# Patient Record
Sex: Male | Born: 1940 | Race: Black or African American | Hispanic: No | Marital: Married | Smoking: Never smoker
Health system: Southern US, Community
[De-identification: ages and names within clinical notes are randomized; demographics above are authoritative.]

## PROBLEM LIST (undated history)

## (undated) DIAGNOSIS — E119 Type 2 diabetes mellitus without complications: Secondary | ICD-10-CM

## (undated) DIAGNOSIS — J449 Chronic obstructive pulmonary disease, unspecified: Secondary | ICD-10-CM

## (undated) DIAGNOSIS — I10 Essential (primary) hypertension: Secondary | ICD-10-CM

## (undated) HISTORY — PX: NASAL POLYP EXCISION: SHX2068

## (undated) HISTORY — PX: BRAIN SURGERY: SHX531

## (undated) HISTORY — DX: Type 2 diabetes mellitus without complications: E11.9

---

## 1998-03-12 ENCOUNTER — Emergency Department (HOSPITAL_COMMUNITY): Admission: EM | Admit: 1998-03-12 | Discharge: 1998-03-12 | Payer: Self-pay | Admitting: Emergency Medicine

## 2005-11-03 ENCOUNTER — Emergency Department (HOSPITAL_COMMUNITY): Admission: EM | Admit: 2005-11-03 | Discharge: 2005-11-03 | Payer: Self-pay | Admitting: Emergency Medicine

## 2005-12-26 ENCOUNTER — Emergency Department (HOSPITAL_COMMUNITY): Admission: EM | Admit: 2005-12-26 | Discharge: 2005-12-26 | Payer: Self-pay | Admitting: Emergency Medicine

## 2005-12-31 ENCOUNTER — Emergency Department (HOSPITAL_COMMUNITY): Admission: EM | Admit: 2005-12-31 | Discharge: 2005-12-31 | Payer: Self-pay | Admitting: Emergency Medicine

## 2006-01-01 ENCOUNTER — Emergency Department: Payer: Self-pay | Admitting: Emergency Medicine

## 2006-02-11 ENCOUNTER — Ambulatory Visit: Payer: Self-pay | Admitting: Unknown Physician Specialty

## 2007-04-05 ENCOUNTER — Emergency Department: Payer: Self-pay | Admitting: Unknown Physician Specialty

## 2007-12-20 ENCOUNTER — Other Ambulatory Visit: Payer: Self-pay

## 2007-12-20 ENCOUNTER — Inpatient Hospital Stay: Payer: Self-pay | Admitting: Internal Medicine

## 2008-02-15 ENCOUNTER — Ambulatory Visit: Payer: Self-pay | Admitting: Internal Medicine

## 2009-01-20 ENCOUNTER — Emergency Department: Payer: Self-pay | Admitting: Emergency Medicine

## 2009-09-25 ENCOUNTER — Inpatient Hospital Stay: Payer: Self-pay | Admitting: Internal Medicine

## 2010-01-30 ENCOUNTER — Ambulatory Visit: Payer: Self-pay | Admitting: Gastroenterology

## 2011-10-20 HISTORY — PX: BRAIN SURGERY: SHX531

## 2012-06-06 ENCOUNTER — Ambulatory Visit: Payer: Self-pay | Admitting: Internal Medicine

## 2012-06-06 DIAGNOSIS — S065XAA Traumatic subdural hemorrhage with loss of consciousness status unknown, initial encounter: Secondary | ICD-10-CM

## 2012-06-06 HISTORY — DX: Traumatic subdural hemorrhage with loss of consciousness status unknown, initial encounter: S06.5XAA

## 2012-07-20 DIAGNOSIS — I82409 Acute embolism and thrombosis of unspecified deep veins of unspecified lower extremity: Secondary | ICD-10-CM

## 2012-07-20 HISTORY — DX: Acute embolism and thrombosis of unspecified deep veins of unspecified lower extremity: I82.409

## 2012-08-10 ENCOUNTER — Encounter: Payer: Self-pay | Admitting: Rehabilitation

## 2012-08-19 ENCOUNTER — Encounter: Payer: Self-pay | Admitting: Rehabilitation

## 2012-09-18 ENCOUNTER — Encounter: Payer: Self-pay | Admitting: Rehabilitation

## 2015-08-25 ENCOUNTER — Emergency Department
Admission: EM | Admit: 2015-08-25 | Discharge: 2015-08-25 | Disposition: A | Discharge status: Home / Self Care | Payer: Medicare Other | Attending: Emergency Medicine | Admitting: Emergency Medicine

## 2015-08-25 ENCOUNTER — Emergency Department: Payer: Medicare Other

## 2015-08-25 ENCOUNTER — Encounter: Payer: Self-pay | Admitting: Emergency Medicine

## 2015-08-25 DIAGNOSIS — R509 Fever, unspecified: Secondary | ICD-10-CM

## 2015-08-25 DIAGNOSIS — J440 Chronic obstructive pulmonary disease with acute lower respiratory infection: Secondary | ICD-10-CM | POA: Insufficient documentation

## 2015-08-25 DIAGNOSIS — Z7951 Long term (current) use of inhaled steroids: Secondary | ICD-10-CM | POA: Diagnosis not present

## 2015-08-25 DIAGNOSIS — I10 Essential (primary) hypertension: Secondary | ICD-10-CM | POA: Diagnosis not present

## 2015-08-25 DIAGNOSIS — Z79899 Other long term (current) drug therapy: Secondary | ICD-10-CM | POA: Diagnosis not present

## 2015-08-25 DIAGNOSIS — J40 Bronchitis, not specified as acute or chronic: Secondary | ICD-10-CM

## 2015-08-25 DIAGNOSIS — B349 Viral infection, unspecified: Secondary | ICD-10-CM

## 2015-08-25 HISTORY — DX: Chronic obstructive pulmonary disease, unspecified: J44.9

## 2015-08-25 HISTORY — DX: Essential (primary) hypertension: I10

## 2015-08-25 LAB — URINALYSIS COMPLETE WITH MICROSCOPIC (ARMC ONLY)
BILIRUBIN URINE: NEGATIVE
Bacteria, UA: NONE SEEN
Glucose, UA: NEGATIVE mg/dL
KETONES UR: NEGATIVE mg/dL
Leukocytes, UA: NEGATIVE
Nitrite: NEGATIVE
Protein, ur: 30 mg/dL — AB
Specific Gravity, Urine: 1.013 (ref 1.005–1.030)
Squamous Epithelial / LPF: NONE SEEN
pH: 5 (ref 5.0–8.0)

## 2015-08-25 LAB — CBC WITH DIFFERENTIAL/PLATELET
BASOS ABS: 0 10*3/uL (ref 0–0.1)
Basophils Relative: 0 %
EOS PCT: 4 %
Eosinophils Absolute: 0.2 10*3/uL (ref 0–0.7)
HCT: 45.6 % (ref 40.0–52.0)
Hemoglobin: 15 g/dL (ref 13.0–18.0)
LYMPHS PCT: 11 %
Lymphs Abs: 0.4 10*3/uL — ABNORMAL LOW (ref 1.0–3.6)
MCH: 29.5 pg (ref 26.0–34.0)
MCHC: 32.9 g/dL (ref 32.0–36.0)
MCV: 89.6 fL (ref 80.0–100.0)
Monocytes Absolute: 0 10*3/uL — ABNORMAL LOW (ref 0.2–1.0)
Monocytes Relative: 1 %
NEUTROS PCT: 84 %
Neutro Abs: 3.1 10*3/uL (ref 1.4–6.5)
PLATELETS: 185 10*3/uL (ref 150–440)
RBC: 5.1 MIL/uL (ref 4.40–5.90)
RDW: 12.7 % (ref 11.5–14.5)
WBC: 3.7 10*3/uL — AB (ref 3.8–10.6)

## 2015-08-25 LAB — COMPREHENSIVE METABOLIC PANEL
ALBUMIN: 3.7 g/dL (ref 3.5–5.0)
ALT: 32 U/L (ref 17–63)
ANION GAP: 6 (ref 5–15)
AST: 31 U/L (ref 15–41)
Alkaline Phosphatase: 66 U/L (ref 38–126)
BILIRUBIN TOTAL: 1.1 mg/dL (ref 0.3–1.2)
BUN: 17 mg/dL (ref 6–20)
CALCIUM: 8.8 mg/dL — AB (ref 8.9–10.3)
CO2: 30 mmol/L (ref 22–32)
Chloride: 103 mmol/L (ref 101–111)
Creatinine, Ser: 1.13 mg/dL (ref 0.61–1.24)
GFR calc Af Amer: >60 mL/min (ref >60)
GFR calc non Af Amer: >60 mL/min (ref >60)
GLUCOSE: 134 mg/dL — AB (ref 65–99)
POTASSIUM: 4 mmol/L (ref 3.5–5.1)
SODIUM: 139 mmol/L (ref 135–145)
Total Protein: 7.1 g/dL (ref 6.5–8.1)

## 2015-08-25 LAB — PROTIME-INR
INR: 0.97
Prothrombin Time: 13.1 seconds (ref 11.4–15.0)

## 2015-08-25 LAB — LACTIC ACID, PLASMA: Lactic Acid, Venous: 2 mmol/L (ref 0.5–2.0)

## 2015-08-25 LAB — TROPONIN I

## 2015-08-25 LAB — LIPASE, BLOOD: Lipase: 26 U/L (ref 11–51)

## 2015-08-25 LAB — APTT: APTT: 24 s (ref 24–36)

## 2015-08-25 MED ORDER — ACETAMINOPHEN 500 MG PO TABS
ORAL_TABLET | ORAL | Status: AC
  Administered 2015-08-25: 1000 mg via ORAL
  Filled 2015-08-25: qty 2

## 2015-08-25 MED ORDER — VANCOMYCIN HCL IN DEXTROSE 1-5 GM/200ML-% IV SOLN
1000.0000 mg | Freq: Once | INTRAVENOUS | Status: AC
  Administered 2015-08-25: 1000 mg via INTRAVENOUS
  Filled 2015-08-25: qty 200

## 2015-08-25 MED ORDER — ACETAMINOPHEN 500 MG PO TABS
1000.0000 mg | ORAL_TABLET | Freq: Once | ORAL | Status: AC
  Administered 2015-08-25: 1000 mg via ORAL

## 2015-08-25 MED ORDER — PREDNISONE 20 MG PO TABS: 60.0000 mg | ORAL_TABLET | Freq: Every day | ORAL | Status: DC

## 2015-08-25 MED ORDER — SODIUM CHLORIDE 0.9 % IV BOLUS (SEPSIS)
1000.0000 mL | INTRAVENOUS | Status: AC
  Administered 2015-08-25 (×3): 1000 mL via INTRAVENOUS

## 2015-08-25 MED ORDER — PIPERACILLIN-TAZOBACTAM 3.375 G IVPB 30 MIN
3.3750 g | Freq: Once | INTRAVENOUS | Status: AC
  Administered 2015-08-25: 3.375 g via INTRAVENOUS
  Filled 2015-08-25: qty 50

## 2015-08-25 MED ORDER — AZITHROMYCIN 250 MG PO TABS: ORAL_TABLET | ORAL | Status: DC
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
## 2015-08-25 NOTE — Discharge Instructions (Signed)
As we discussed, we do not know for sure what is causing your fever, chills, and other symptoms today.  You may simply be suffering from a viral infection, but given your abnormal vital signs, we encouraged her to stay in the hospital.  However, since you do not want to stay, we encourage you to take the medications prescribed, follow up with your regular doctor at the next available opportunity, and return immediately to the emergency department if you develop any new or worsening symptoms that concern you.   Upper Respiratory Infection, Adult Most upper respiratory infections (URIs) are a viral infection of the air passages leading to the lungs. A URI affects the nose, throat, and upper air passages. The most common type of URI is nasopharyngitis and is typically referred to as "the common cold." URIs run their course and usually go away on their own. Most of the time, a URI does not require medical attention, but sometimes a bacterial infection in the upper airways can follow a viral infection. This is called a secondary infection. Sinus and middle ear infections are common types of secondary upper respiratory infections. Bacterial pneumonia can also complicate a URI. A URI can worsen asthma and chronic obstructive pulmonary disease (COPD). Sometimes, these complications can require emergency medical care and may be life threatening.  CAUSES Almost all URIs are caused by viruses. A virus is a type of germ and can spread from one person to another.  RISKS FACTORS You may be at risk for a URI if:   You smoke.   You have chronic heart or lung disease.  You have a weakened defense (immune) system.   You are very young or very old.   You have nasal allergies or asthma.  You work in crowded or poorly ventilated areas.  You work in health care facilities or schools. SIGNS AND SYMPTOMS  Symptoms typically develop 2-3 days after you come in contact with a cold virus. Most viral URIs last 7-10  days. However, viral URIs from the influenza virus (flu virus) can last 14-18 days and are typically more severe. Symptoms may include:   Runny or stuffy (congested) nose.   Sneezing.   Cough.   Sore throat.   Headache.   Fatigue.   Fever.   Loss of appetite.   Pain in your forehead, behind your eyes, and over your cheekbones (sinus pain).  Muscle aches.  DIAGNOSIS  Your health care provider may diagnose a URI by:  Physical exam.  Tests to check that your symptoms are not due to another condition such as:  Strep throat.  Sinusitis.  Pneumonia.  Asthma. TREATMENT  A URI goes away on its own with time. It cannot be cured with medicines, but medicines may be prescribed or recommended to relieve symptoms. Medicines may help:  Reduce your fever.  Reduce your cough.  Relieve nasal congestion. HOME CARE INSTRUCTIONS   Take medicines only as directed by your health care provider.   Gargle warm saltwater or take cough drops to comfort your throat as directed by your health care provider.  Use a warm mist humidifier or inhale steam from a shower to increase air moisture. This may make it easier to breathe.  Drink enough fluid to keep your urine clear or pale yellow.   Eat soups and other clear broths and maintain good nutrition.   Rest as needed.   Return to work when your temperature has returned to normal or as your health care provider advises. You  may need to stay home longer to avoid infecting others. You can also use a face mask and careful hand washing to prevent spread of the virus.  Increase the usage of your inhaler if you have asthma.   Do not use any tobacco products, including cigarettes, chewing tobacco, or electronic cigarettes. If you need help quitting, ask your health care provider. PREVENTION  The best way to protect yourself from getting a cold is to practice good hygiene.   Avoid oral or hand contact with people with cold  symptoms.   Wash your hands often if contact occurs.  There is no clear evidence that vitamin C, vitamin E, echinacea, or exercise reduces the chance of developing a cold. However, it is always recommended to get plenty of rest, exercise, and practice good nutrition.  SEEK MEDICAL CARE IF:   You are getting worse rather than better.   Your symptoms are not controlled by medicine.   You have chills.  You have worsening shortness of breath.  You have brown or red mucus.  You have yellow or brown nasal discharge.  You have pain in your face, especially when you bend forward.  You have a fever.  You have swollen neck glands.  You have pain while swallowing.  You have white areas in the back of your throat. SEEK IMMEDIATE MEDICAL CARE IF:   You have severe or persistent:  Headache.  Ear pain.  Sinus pain.  Chest pain.  You have chronic lung disease and any of the following:  Wheezing.  Prolonged cough.  Coughing up blood.  A change in your usual mucus.  You have a stiff neck.  You have changes in your:  Vision.  Hearing.  Thinking.  Mood. MAKE SURE YOU:   Understand these instructions.  Will watch your condition.  Will get help right away if you are not doing well or get worse.   This information is not intended to replace advice given to you by your health care provider. Make sure you discuss any questions you have with your health care provider.   Document Released: 03/31/2001 Document Revised: 02/19/2015 Document Reviewed: 01/10/2014 Elsevier Interactive Patient Education [employer].

## 2015-08-25 NOTE — ED Notes (Signed)
Patient reports having chills.  Denies any other complaints.

## 2015-08-26 ENCOUNTER — Telehealth: Payer: Self-pay | Admitting: Emergency Medicine

## 2015-08-26 ENCOUNTER — Encounter: Payer: Self-pay | Admitting: Emergency Medicine

## 2015-08-26 ENCOUNTER — Emergency Department
Admission: EM | Admit: 2015-08-26 | Discharge: 2015-08-26 | Disposition: A | Discharge status: Home / Self Care | Payer: Medicare Other | Attending: Emergency Medicine | Admitting: Emergency Medicine

## 2015-08-26 DIAGNOSIS — R7881 Bacteremia: Secondary | ICD-10-CM | POA: Diagnosis not present

## 2015-08-26 DIAGNOSIS — Z792 Long term (current) use of antibiotics: Secondary | ICD-10-CM | POA: Insufficient documentation

## 2015-08-26 DIAGNOSIS — Z7952 Long term (current) use of systemic steroids: Secondary | ICD-10-CM | POA: Diagnosis not present

## 2015-08-26 DIAGNOSIS — R0981 Nasal congestion: Secondary | ICD-10-CM | POA: Diagnosis not present

## 2015-08-26 DIAGNOSIS — R509 Fever, unspecified: Secondary | ICD-10-CM | POA: Diagnosis present

## 2015-08-26 DIAGNOSIS — Z79899 Other long term (current) drug therapy: Secondary | ICD-10-CM | POA: Insufficient documentation

## 2015-08-26 DIAGNOSIS — Z7951 Long term (current) use of inhaled steroids: Secondary | ICD-10-CM | POA: Diagnosis not present

## 2015-08-26 DIAGNOSIS — I10 Essential (primary) hypertension: Secondary | ICD-10-CM | POA: Diagnosis not present

## 2015-08-26 LAB — CBC WITH DIFFERENTIAL/PLATELET
Basophils Absolute: 0 10*3/uL (ref 0–0.1)
Basophils Relative: 0 %
EOS PCT: 2 %
Eosinophils Absolute: 0.2 10*3/uL (ref 0–0.7)
HCT: 41.8 % (ref 40.0–52.0)
Hemoglobin: 13.8 g/dL (ref 13.0–18.0)
LYMPHS ABS: 0.9 10*3/uL — AB (ref 1.0–3.6)
LYMPHS PCT: 8 %
MCH: 29.5 pg (ref 26.0–34.0)
MCHC: 33 g/dL (ref 32.0–36.0)
MCV: 89.3 fL (ref 80.0–100.0)
MONO ABS: 0.9 10*3/uL (ref 0.2–1.0)
MONOS PCT: 8 %
Neutro Abs: 9 10*3/uL — ABNORMAL HIGH (ref 1.4–6.5)
Neutrophils Relative %: 82 %
PLATELETS: 193 10*3/uL (ref 150–440)
RBC: 4.68 MIL/uL (ref 4.40–5.90)
RDW: 12.5 % (ref 11.5–14.5)
WBC: 11 10*3/uL — ABNORMAL HIGH (ref 3.8–10.6)

## 2015-08-26 LAB — LACTIC ACID, PLASMA: LACTIC ACID, VENOUS: 1.2 mmol/L (ref 0.5–2.0)

## 2015-08-26 LAB — COMPREHENSIVE METABOLIC PANEL
ALT: 30 U/L (ref 17–63)
ANION GAP: 3 — AB (ref 5–15)
AST: 25 U/L (ref 15–41)
Albumin: 3.6 g/dL (ref 3.5–5.0)
Alkaline Phosphatase: 53 U/L (ref 38–126)
BUN: 21 mg/dL — ABNORMAL HIGH (ref 6–20)
CALCIUM: 8.5 mg/dL — AB (ref 8.9–10.3)
CHLORIDE: 105 mmol/L (ref 101–111)
CO2: 28 mmol/L (ref 22–32)
CREATININE: 1.04 mg/dL (ref 0.61–1.24)
Glucose, Bld: 120 mg/dL — ABNORMAL HIGH (ref 65–99)
Potassium: 3.9 mmol/L (ref 3.5–5.1)
Sodium: 136 mmol/L (ref 135–145)
Total Bilirubin: 0.8 mg/dL (ref 0.3–1.2)
Total Protein: 6.5 g/dL (ref 6.5–8.1)

## 2015-08-26 MED ORDER — SULFAMETHOXAZOLE-TRIMETHOPRIM 800-160 MG PO TABS: 1.0000 | ORAL_TABLET | Freq: Once | ORAL | Status: DC

## 2015-08-26 MED ORDER — SULFAMETHOXAZOLE-TRIMETHOPRIM 800-160 MG PO TABS
1.0000 | ORAL_TABLET | Freq: Once | ORAL | Status: AC
  Administered 2015-08-26: 1 via ORAL
  Filled 2015-08-26: qty 1

## 2015-08-26 MED ORDER — CEFTRIAXONE SODIUM 1 G IJ SOLR
1.0000 g | Freq: Once | INTRAMUSCULAR | Status: AC
  Administered 2015-08-26: 1 g via INTRAVENOUS
  Filled 2015-08-26: qty 10

## 2015-08-26 NOTE — ED Notes (Signed)
Seen here yesterday had developed chills, fever, yesterday vomited twice

## 2015-08-26 NOTE — Discharge Instructions (Signed)
One of your blood culture bottles came back positive from Sunday morning. You appear well without fever since then. Your heart rate and blood pressure are good. We spoke with your primary physician, Dr. Conley Rolls say, and with Dr. Ola Spurr, infectious disease. You were treated with an IV antibiotic in the emergency department and you're being prescribed Bactrim in addition to the azithromycin you're currently taking. Take Bactrim twice a day for 10 days.   If you feel worse, if he feel weak or have fevers, or if you have other urgent concerns, please return to the emergency department. See your regular doctor in 1-2 days for recheck.  Bacteremia Bacteremia is the presence of bacteria in the blood. A small amount of bacteria may not cause any symptoms. Sometimes, the bacteria spread and cause infection in other parts of the body, such as the heart, joints, bones, or brain. Having a great amount of bacteria can cause a serious, sometimes life-threatening infection called sepsis. CAUSES This condition is caused by bacteria that get into the blood. Bacteria can enter the blood:  During a dental or medical procedure.  After you brush your teeth so hard that the gums bleed.  Through a scrape or cut on your skin. More severe types of bacteremia can be caused by:  A bacterial infection, such as pneumonia, that spreads to the blood.  Using a dirty needle. RISK FACTORS This condition is more likely to develop in:  Children and elderly adults.  People who have a long-lasting (chronic) disease or medical condition.  People who have an artificial joint or heart valve.  People who have heart valve disease.  People who have a tube, such as a catheter or IV tube, that has been inserted for a medical treatment.  People who have a weak body defense system (immune system).  People who use IV drugs. SYMPTOMS Usually, this condition does not cause symptoms when it is mild. When it is more serious, it  may cause:  Fever.  Chills.  Racing heart.  Shortness of breath.  Dizziness.  Weakness.  Confusion.  Nausea or vomiting.  Diarrhea. Bacteremia that has spread to other parts of the body may cause symptoms in those areas. DIAGNOSIS This condition may be diagnosed with a physical exam and tests, such as:  A complete blood count (CBC). This test looks for signs of infection.  Blood cultures. These look for bacteria in your blood.  Tests of any IV tubes. These look for a source of infection.  Urine tests.  Imaging tests, such as an X-ray, CT scan, MRI, or heart ultrasound. TREATMENT If the condition is mild, treatment is usually not needed. Usually, the body's immune system will remove the bacteria. If the condition is more serious, it may be treated with:  Antibiotic medicines through an IV tube. These may be given for about 2 weeks. At first, the antibiotic that is given may kill most types of blood bacteria. If your test results show that a certain kind of bacteria is causing problems, the antibiotic may be changed to kill only the bacteria that are causing problems.  Antibiotics taken by mouth.  Removing any catheter or IV tube that is a source of infection.  Blood pressure and breathing support, if needed.  Surgery to control the source or spread of infection, if needed. HOME CARE INSTRUCTIONS  Take over-the-counter and prescription medicines only as told by your health care provider.  If you were prescribed an antibiotic, take it as told by your  health care provider. Do not stop taking the antibiotic even if you start to feel better.  Rest at home until your condition is under control.  Drink enough fluid to keep your urine clear or pale yellow.  Keep all follow-up visits as told by your health care provider. This is important. PREVENTION Take these actions to help prevent future episodes of bacteremia:  Get all vaccinations as recommended by your health  care provider.  Clean and cover scrapes or cuts.  Bathe regularly.  Wash your hands often.  Before any dental or surgical procedure, ask your health care provider if you should take an antibiotic. SEEK MEDICAL CARE IF:  Your symptoms get worse.  You continue to have symptoms after treatment.  You develop new symptoms after treatment. SEEK IMMEDIATE MEDICAL CARE IF:  You have chest pain or trouble breathing.  You develop confusion, dizziness, or weakness.  You develop pale skin.   This information is not intended to replace advice given to you by your health care provider. Make sure you discuss any questions you have with your health care provider.   Document Released: 07/19/2006 Document Revised: 06/26/2015 Document Reviewed: 12/08/2014 Elsevier Interactive Patient Education [employer].

## 2015-08-26 NOTE — ED Provider Notes (Signed)
[facility] Emergency Department Provider Note  ____________________________________________  Time seen:  1505  I have reviewed the triage vital signs and the nursing notes.   HISTORY  Chief Complaint Positive blood culture Fever, chills, 2 days ago    HPI Oscar Davis is a 74 y.o. male who is seen in the emergency department on Sunday, November 6 at the early hours of the morning. He came in at 4 AM due to fever and chills. He was seen by Dr. Karma Greaser. Please see his history and physical for additional details from that visit. During that visit, Dr. Karma Greaser was concerned about possible sepsis. Blood cultures were drawn. The patient preferred to be discharged home. One of the culture bottles grew out gram-negative rods. The patient was contacted by the emergency department. The emergency department had also contacted the patient's primary physician, Dr. Conley Rolls se. The patient was told to return to the emergency department by the primary physician as well as emergency department.  At this time, the patient reports he feels well. His wife confirms this. They say he has not had any fever or chills since he left the emergency department on Sunday morning. He has not had any rigors, chest pain, shortness of breath, or other complaints.    Past Medical History  Diagnosis Date  . COPD (chronic obstructive pulmonary disease) ([place])   . Hypertension     There are no active problems to display for this patient.   Past Surgical History  Procedure Laterality Date  . Brain surgery      Current Outpatient Rx  Name  Route  Sig  Dispense  Refill  . albuterol (PROVENTIL HFA;VENTOLIN HFA) 108 (90 BASE) MCG/ACT inhaler   Inhalation   Inhale 2 puffs into the lungs every 6 (six) hours as needed for wheezing or shortness of breath.         Marland Kitchen amLODipine (NORVASC) 5 MG tablet   Oral   Take 5 mg by mouth daily.         Marland Kitchen azithromycin (ZITHROMAX) 250 MG tablet     Take 2 tablets PO on day 1, then take 1 tablet PO daily for 4 more days Patient taking differently: Take 250-500 mg by mouth daily. Pt takes two tablets on day 1 and one tablet for 4 more days.   6 each   0   . chlorthalidone (HYGROTON) 25 MG tablet   Oral   Take 25 mg by mouth daily.         . Fluticasone-Salmeterol (ADVAIR) 250-50 MCG/DOSE AEPB   Inhalation   Inhale 1 puff into the lungs 2 (two) times daily.         . predniSONE (DELTASONE) 20 MG tablet   Oral   Take 3 tablets (60 mg total) by mouth daily.   15 tablet   0     Allergies Review of patient's allergies indicates no known allergies.  No family history on file.  Social History Social History  Substance Use Topics  . Smoking status: Never Smoker   . Smokeless tobacco: None  . Alcohol Use: No    Review of Systems  Constitutional: Negative for fatigue. Positive for fever Sunday morning, none since. ENT: Patient had some congestion Sunday morning. No significant congestion now. Cardiovascular: Negative for chest pain. Respiratory: Negative for cough. Gastrointestinal: Negative for abdominal pain, vomiting and diarrhea. Genitourinary: Negative for dysuria. Musculoskeletal: No myalgias or injuries. Skin: Negative for rash. Neurological: Negative for headache or focal weakness  10-point ROS otherwise negative.  ____________________________________________   PHYSICAL EXAM:  VITAL SIGNS: ED Triage Vitals  Enc Vitals Group     BP 08/26/15 1155 154/79 mmHg     Pulse Rate 08/26/15 1155 80     Resp 08/26/15 1155 16     Temp 08/26/15 1155 98 F (36.7 C)     Temp Source 08/26/15 1155 Oral     SpO2 08/26/15 1155 96 %     Weight 08/26/15 1155 195 lb (88.451 kg)     Height 08/26/15 1155 5\' 10"  (1.778 m)     Head Cir --      Peak Flow --      Pain Score 08/26/15 1156 0     Pain Loc --      Pain Edu? --      Excl. in [place]? --     Constitutional:  Alert and oriented. Well appearing and in no  distress. ENT   Head: Normocephalic and atraumatic.   Nose: No congestion/rhinnorhea.       Mouth: No erythema, no swelling   Cardiovascular: Normal rate, regular rhythm, no murmur noted Respiratory:  Normal respiratory effort, no tachypnea.    Breath sounds are clear and equal bilaterally.  Gastrointestinal: Soft and nontender. No distention.  Back: No muscle spasm, no tenderness, no CVA tenderness. Musculoskeletal: No deformity noted. Nontender with normal range of motion in all extremities.  No noted edema. Neurologic:  Communicative. Normal appearing spontaneous movement in all 4 extremities. No gross focal neurologic deficits are appreciated.  Skin:  Skin is warm, dry. No rash noted. Psychiatric: Mood and affect are normal. Speech and behavior are normal.  ____________________________________________    LABS (pertinent positives/negatives)  Labs Reviewed  CBC WITH DIFFERENTIAL/PLATELET - Abnormal; Notable for the following:    WBC 11.0 (*)    Neutro Abs 9.0 (*)    Lymphs Abs 0.9 (*)    All other components within normal limits  CULTURE, BLOOD (ROUTINE X 2)  CULTURE, BLOOD (ROUTINE X 2)  COMPREHENSIVE METABOLIC PANEL  LACTIC ACID, PLASMA    ____________________________________________   INITIAL IMPRESSION / ASSESSMENT AND PLAN / ED COURSE  Pertinent labs & imaging results that were available during my care of the patient were reviewed by me and considered in my medical decision making (see chart for details).   Well-appearing 74 year old male in no acute distress. His heart rate is normal at 73. His blood pressure is normal. He has no fever.  It is unclear if the gram-negative rods in 1 bottle of his blood culture from Sunday a.m. is Earnie Larsson or a contaminant. Given his fevers, I tend to suspect it is real, however he looks quite well at this time.  I have reviewed the patient's lab work, chest x-ray, and urinalysis from yesterday.  We will repeat the blood  cultures and obtain a lactic acid level to be sure that it is less than what he had yesterday morning.  If all looks well, I expect the patient to be able to be discharged home with close follow-up with Dr. Conley Rolls se. We will speak with Dr. Conley Rolls se to help ensure this follow-up.   ----------------------------------------- 5:22 PM on 08/26/2015 -----------------------------------------  There was a delay and blood draw for this patient. Results are now coming back. His white blood cell count is 11,000. Lactic acid is pending. Blood cultures are pending.  I've spoken with Dr. Mathis Bud,.  He has asked me to speak with infectious disease before  discharge, but if they aren't have a reservation, he agrees with discharge and will see the patient the office in 2-3 days.  Meanwhile, reexamination the patient finds and looking quite well. He is energetic and does not appear ill. He feels well and is eager to go home.  ----------------------------------------- 5:33 PM on 08/26/2015 -----------------------------------------  Discussed with Dr. Adrian Prows. He has advised [place] to give the patient a dose of IV ceftriaxone and to discharge the patient on Bactrim for 10 days. He will follow-up on the cultures as well.  Given the patient's well appearance, he agrees with the discharge.  ____________________________________________   FINAL CLINICAL IMPRESSION(S) / ED DIAGNOSES  Final diagnoses:  Positive blood culture      Ahmed Prima, MD 08/26/15 2001

## 2015-08-26 NOTE — ED Notes (Signed)
Called patient to ask hime to return due to positive blood culture.  He says he feels great, and he really does not want to return to the ED.  I asked him to be checked by  His pcp and he agrees.  I willcall the office when it is open today.

## 2015-08-26 NOTE — ED Notes (Signed)
Notified by lab of positive blood culture - aerobic bottle with gram negative rods.

## 2015-08-26 NOTE — ED Notes (Signed)
Seen in ED Sunday morning and was discharged.  Patient was called this morning, told to follow up with PCP.  Patient went to PCP who sent patient to ED for treatment.  Patient states blood cultures drawn on Sunday were positive for gram negative rods.

## 2015-08-26 NOTE — ED Provider Notes (Signed)
-----------------------------------------   6:48 AM on 08/26/2015 -----------------------------------------  I was made aware by the charge nurse, Dawn, that one of the blood cultures for this patient came back positive for gram-negative rods.  She is going to contact Kris Mouton to call the patient and have him return to the emergency department for treatment for gram-negative rod bacteremia.    Hinda Kehr, MD 08/26/15 [phone]

## 2015-08-26 NOTE — ED Notes (Signed)
Lungs clear, ate food at [place] party 2 days ago

## 2015-08-27 ENCOUNTER — Telehealth: Payer: Self-pay | Admitting: Emergency Medicine

## 2015-08-27 ENCOUNTER — Telehealth: Payer: Self-pay | Admitting: Infectious Diseases

## 2015-08-27 LAB — URINE CULTURE: CULTURE: NO GROWTH

## 2015-08-27 MED ORDER — CIPROFLOXACIN HCL 500 MG PO TABS: 500.0000 mg | ORAL_TABLET | Freq: Two times a day (BID) | ORAL | Status: AC

## 2015-08-27 NOTE — Telephone Encounter (Signed)
I spoke with patients wife and patient. He feels fine.. He has started bactrim. Cx is growing pseudomonas, sensis pending.  Pseudomonas is not sensitive to ceftriaxone or bactrim  Will send in cipro 500 bid for 10 days I have sent in to pharmacy.

## 2015-08-27 NOTE — ED Notes (Signed)
Patient called because he said he was called today about changing antibiotic to septra--says he just picked up his septra.   i asked him about his pcp as we spoke about follow up yesterday am.  He says that he has appt with dr Elijio Miles on Thursday.  Per patient chart it says patient is supposed to change to cipro per dr fitzgerald .  I told him to take that and not the septra.

## 2015-08-30 LAB — CULTURE, BLOOD (ROUTINE X 2): Culture: NO GROWTH

## 2015-08-31 LAB — CULTURE, BLOOD (ROUTINE X 2)
CULTURE: NO GROWTH
Culture: NO GROWTH

## 2015-08-31 NOTE — Progress Notes (Signed)
Mr. Oscar Davis had a blood culture result in growing Pseudomonas aeruginosa. Patient was discharged on an antimicrobial that would not cover pseudomonas  MD Ola Spurr contacted patient and sent a Rx for Cipro 500 mg PO BID x 10 days to the patient's pharmacy per note on 08/27/15.   Larene Beach, PharmD

## 2017-11-25 ENCOUNTER — Encounter: Payer: Self-pay | Admitting: Internal Medicine

## 2017-11-25 ENCOUNTER — Ambulatory Visit (INDEPENDENT_AMBULATORY_CARE_PROVIDER_SITE_OTHER): Payer: Medicare Other | Admitting: Internal Medicine

## 2017-11-25 VITALS — BP 138/81 | HR 72 | Resp 16 | Ht 70.0 in | Wt 207.0 lb

## 2017-11-25 DIAGNOSIS — J449 Chronic obstructive pulmonary disease, unspecified: Secondary | ICD-10-CM | POA: Diagnosis not present

## 2017-11-25 DIAGNOSIS — Z77090 Contact with and (suspected) exposure to asbestos: Secondary | ICD-10-CM | POA: Diagnosis not present

## 2017-11-25 DIAGNOSIS — J984 Other disorders of lung: Secondary | ICD-10-CM | POA: Insufficient documentation

## 2017-11-25 DIAGNOSIS — J349 Unspecified disorder of nose and nasal sinuses: Secondary | ICD-10-CM | POA: Insufficient documentation

## 2017-11-25 DIAGNOSIS — R0602 Shortness of breath: Secondary | ICD-10-CM | POA: Diagnosis not present

## 2017-11-25 NOTE — Patient Instructions (Signed)

## 2017-11-25 NOTE — Progress Notes (Signed)
[facility] [place], [place] [address]  Pulmonary Sleep Medicine  Office Visit Note  Patient Name: Oscar Davis DOB: Oct 14, 1941 MRN [id number]  Date of Service: 11/25/2017  Complaints/HPI: He has a history of exposure to asbestos. 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 Patient states that he has never smoked. He did not serve in the [employer]. Patient states not a drinker. Patient states that he has SOB noted. On minimal exertion he gets winded. Patient states that at times he has to sit and catch his breath. He is currently not on oxygen has been on it in the past. Currently he takes albuterol advair. Patient has no cardiac issues. 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 Eyes: (-) visual changes, (-) redness, (-) itching. Nose and Sinuses: (-) nasal stuffiness or itchiness, (-) postnasal drip, (-) nosebleeds, (-) sinus trouble. Mouth and Throat: (-) sore throat, (-) hoarseness. Neck: (-) swollen glands, (-) enlarged thyroid, (-) neck pain. Respiratory: + cough, (-) bloody sputum, + shortness of breath, + wheezing. Cardiovascular: - ankle swelling, (-) chest pain. Lymphatic: (-) lymph node enlargement. Neurologic: (-) numbness, (-) tingling. Psychiatric: (-) anxiety, (-) depression   Current Medication: Outpatient Encounter Medications as of 11/25/2017  Medication Sig  . albuterol (PROVENTIL HFA;VENTOLIN HFA) 108 (90 BASE) MCG/ACT inhaler Inhale 2 puffs into the lungs every 6 (six) hours as needed for wheezing or shortness of breath.  Marland Kitchen amLODipine (NORVASC) 5 MG tablet Take 5 mg by mouth daily.  Marland Kitchen azithromycin  (ZITHROMAX) 250 MG tablet Take 2 tablets PO on day 1, then take 1 tablet PO daily for 4 more days (Patient taking differently: Take 250-500 mg by mouth daily. Pt takes two tablets on day 1 and one tablet for 4 more days.)  . chlorthalidone (HYGROTON) 25 MG tablet Take 25 mg by mouth daily.  . Fluticasone-Salmeterol (ADVAIR) 250-50 MCG/DOSE AEPB Inhale 1 puff into the lungs 2 (two) times daily.  . predniSONE (DELTASONE) 20 MG tablet Take 3 tablets (60 mg total) by mouth daily.  Marland Kitchen sulfamethoxazole-trimethoprim (BACTRIM DS,SEPTRA DS) 800-160 MG tablet Take 1 tablet by mouth once.   No facility-administered encounter medications on file as of 11/25/2017.     Surgical History: Past Surgical History:  Procedure Laterality Date  . BRAIN SURGERY      Medical History: Past Medical History:  Diagnosis Date  . COPD (chronic obstructive pulmonary disease) ([place])   . Hypertension     Family History: History reviewed. No pertinent family history.  Social History: Social History   Socioeconomic History  . Marital status: Married    Spouse name: Not on file  . Number of children: Not on file  . Years of education: Not on file  . Highest education level: Not on file  Social Needs  . Financial resource strain: Not on file  . Food insecurity - worry: Not on file  . Food insecurity - inability: Not on file  . Transportation needs - medical: Not on file  . Transportation needs - non-medical: Not on file  Occupational History  . Not on file  Tobacco Use  . Smoking status: Never  Smoker  . Smokeless tobacco: Never Used  Substance and Sexual Activity  . Alcohol use: No  . Drug use: No  . Sexual activity: Not on file  Other Topics Concern  . Not on file  Social History Narrative  . Not on file    Vital Signs: Blood pressure 138/81, pulse 72, resp. rate 16, height 5\' 10"  (1.778 m), weight 207 lb (93.9 kg), SpO2 94 %.  Examination: General Appearance: The patient is well-developed,  well-nourished, and in no distress. Skin: Gross inspection of skin unremarkable. Head: normocephalic, no gross deformities. Eyes: no gross deformities noted. ENT: ears appear grossly normal no exudates. Neck: Supple. No thyromegaly. No LAD. Respiratory: scattered rhonchi noted. Cardiovascular: Normal S1 and S2 without murmur or rub. Extremities: No cyanosis. pulses are equal. Neurologic: Alert and oriented. No involuntary movements.  LABS: No results found for this or any previous visit (from the past 2160 hour(s)).  Radiology: No results found.  No results found.  No results found.    Assessment and Plan: Patient Active Problem List   Diagnosis Date Noted  . COPD (chronic obstructive pulmonary disease) ([place]) 11/25/2017  . Lung abnormality 11/25/2017  . Sinus disorder 11/25/2017  . 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 COPD non-smoker negative CXR in 2016 no recent PFT on file. Will continue with inhalers as prescribed. Also will get PFT ordered to reassess lung fucntion 2. SOB will need to continue with inhalers. Symptoms are worse than expected for a non-smoker 3. Asbestos exposure I would suggest getting a CT chest with contrast at this point to assess  General Counseling: I have discussed the findings of the evaluation and examination with Oscar Davis.  I have also discussed any further diagnostic evaluation thatmay be needed or ordered today. Oscar Davis verbalizes understanding of the findings of todays visit. We also reviewed his medications today and discussed drug interactions and side effects including but not limited excessive drowsiness and altered mental states. We also discussed that there is always a risk not just to him but also people around him. he has been encouraged to call the office with any questions or concerns that should arise related to todays visit.    Time spent: 48min  I have personally obtained a history, examined the patient, evaluated laboratory and imaging  results, formulated the assessment and plan and placed orders.    [name], MD [facility] Pulmonary and Critical Care Sleep medicine

## 2017-11-26 ENCOUNTER — Telehealth: Payer: Self-pay

## 2017-11-26 NOTE — Telephone Encounter (Signed)
Patient has been advised on his Ct Chest appt scheduled for feb 15 @ 10:00 kp/titania

## 2017-12-03 ENCOUNTER — Ambulatory Visit
Admission: RE | Admit: 2017-12-03 | Discharge: 2017-12-03 | Disposition: A | Discharge status: Home / Self Care | Payer: Medicare Other | Source: Ambulatory Visit | Attending: Internal Medicine | Admitting: Internal Medicine

## 2017-12-03 DIAGNOSIS — Z77090 Contact with and (suspected) exposure to asbestos: Secondary | ICD-10-CM | POA: Diagnosis present

## 2017-12-03 DIAGNOSIS — I700 Atherosclerosis of aorta: Secondary | ICD-10-CM | POA: Insufficient documentation

## 2017-12-03 DIAGNOSIS — I2510 Atherosclerotic heart disease of native coronary artery without angina pectoris: Secondary | ICD-10-CM | POA: Insufficient documentation

## 2017-12-03 LAB — POCT I-STAT CREATININE: CREATININE: 1 mg/dL (ref 0.61–1.24)

## 2017-12-03 MED ORDER — IOPAMIDOL (ISOVUE-300) INJECTION 61%
75.0000 mL | Freq: Once | INTRAVENOUS | Status: AC | PRN
  Administered 2017-12-03: 75 mL via INTRAVENOUS

## 2017-12-15 ENCOUNTER — Ambulatory Visit: Payer: Medicare Other | Admitting: Internal Medicine

## 2017-12-15 DIAGNOSIS — R0602 Shortness of breath: Secondary | ICD-10-CM | POA: Diagnosis not present

## 2017-12-23 ENCOUNTER — Encounter: Payer: Self-pay | Admitting: Internal Medicine

## 2017-12-23 ENCOUNTER — Ambulatory Visit: Payer: Medicare Other | Admitting: Internal Medicine

## 2017-12-23 VITALS — BP 152/94 | HR 74 | Resp 16 | Ht 70.0 in | Wt 207.8 lb

## 2017-12-23 DIAGNOSIS — J8489 Other specified interstitial pulmonary diseases: Secondary | ICD-10-CM | POA: Diagnosis not present

## 2017-12-23 DIAGNOSIS — Z77090 Contact with and (suspected) exposure to asbestos: Secondary | ICD-10-CM | POA: Diagnosis not present

## 2017-12-23 DIAGNOSIS — J449 Chronic obstructive pulmonary disease, unspecified: Secondary | ICD-10-CM

## 2017-12-23 MED ORDER — AZITHROMYCIN 250 MG PO TABS: ORAL_TABLET | ORAL | 0 refills | Status: DC

## 2017-12-23 NOTE — Patient Instructions (Signed)

## 2017-12-23 NOTE — Progress Notes (Signed)
Oscar Davis, [id number]  DATE OF SERVICE:  December 15, 2017  Complete Pulmonary Function Testing Interpretation:  FINDINGS:   forced vital capacity is mildly decreased FEV1 is 1.64 L which is 62% of predicted.  FEV1 FVC ratio is 66% of predicted is mildly decreased.  Post bronchodilator there is no significant improvement in the FEV1 however clinical improvement may occur in the absence of spirometric improvement and should not preclude the use of bronchodilators.  Total lung capacity is normal by body box residual volume is normal residual volume total lung capacity ratio is increased FRC is increase DLCO is normal  IMPRESSION:   this pulmonary function study is consistent with moderate obstructive lung disease.  There is no significant improvement post bronchodilator.  The DLCO is within normal limits  [name], MD [facility] Pulmonary Critical Care Medicine Sleep Medicine

## 2017-12-23 NOTE — Patient Instructions (Signed)
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
## 2018-01-27 ENCOUNTER — Ambulatory Visit: Payer: Self-pay | Admitting: Internal Medicine
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
## 2019-12-01 ENCOUNTER — Ambulatory Visit: Payer: Medicare Other | Attending: Internal Medicine

## 2019-12-01 ENCOUNTER — Other Ambulatory Visit: Payer: Self-pay

## 2019-12-01 DIAGNOSIS — Z23 Encounter for immunization: Secondary | ICD-10-CM | POA: Insufficient documentation

## 2019-12-01 NOTE — Progress Notes (Signed)
   Covid-19 Vaccination Clinic  Name:  Oscar Davis    MRN: [id number] DOB: June 15, 1941  12/01/2019  Mr. [name] was observed post Covid-19 immunization for 30 minutes based on pre-vaccination screening without incidence. He was provided with Vaccine Information Sheet and instruction to access the V-Safe system.   Mr. [name] was instructed to call 911 with any severe reactions post vaccine: Marland Kitchen Difficulty breathing  . Swelling of your face and throat  . A fast heartbeat  . A bad rash all over your body  . Dizziness and weakness    Immunizations Administered    Name Date Dose VIS Date Route   Moderna COVID-19 Vaccine 12/01/2019  9:56 AM 0.5 mL 09/19/2019 Intramuscular   Manufacturer: Moderna   Lot: [id number]   HuntPO:[id number]

## 2020-01-01 ENCOUNTER — Ambulatory Visit: Payer: Medicare Other | Attending: Internal Medicine

## 2020-01-01 DIAGNOSIS — Z23 Encounter for immunization: Secondary | ICD-10-CM

## 2020-01-01 NOTE — Progress Notes (Signed)
   Covid-19 Vaccination Clinic  Name:  Fiyinfoluwa Iaquinta    MRN: [id number] DOB: [birth year]  01/01/2020  Mr. Wickliffe was observed post Covid-19 immunization for 15 minutes without incident. He was provided with Vaccine Information Sheet and instruction to access the V-Safe system.   Mr. Suares was instructed to call 911 with any severe reactions post vaccine: Marland Kitchen Difficulty breathing  . Swelling of face and throat  . A fast heartbeat  . A bad rash all over body  . Dizziness and weakness   Immunizations Administered    Name Date Dose VIS Date Route   Moderna COVID-19 Vaccine 01/01/2020  9:57 AM 0.5 mL 09/19/2019 Intramuscular   Manufacturer: Moderna   Lot: [id number]   FinneyPO:[id number]

## 2020-03-07 ENCOUNTER — Other Ambulatory Visit
Admission: RE | Admit: 2020-03-07 | Discharge: 2020-03-07 | Disposition: A | Discharge status: Home / Self Care | Payer: Medicare Other | Source: Ambulatory Visit | Attending: Internal Medicine | Admitting: Internal Medicine

## 2020-03-07 ENCOUNTER — Other Ambulatory Visit: Payer: Self-pay

## 2020-03-07 DIAGNOSIS — Z01812 Encounter for preprocedural laboratory examination: Secondary | ICD-10-CM | POA: Insufficient documentation

## 2020-03-07 DIAGNOSIS — Z20822 Contact with and (suspected) exposure to covid-19: Secondary | ICD-10-CM | POA: Diagnosis not present

## 2020-03-08 ENCOUNTER — Encounter: Payer: Self-pay | Admitting: Internal Medicine

## 2020-03-08 LAB — SARS CORONAVIRUS 2 (TAT 6-24 HRS): SARS Coronavirus 2: NEGATIVE

## 2020-03-11 ENCOUNTER — Ambulatory Visit
Admission: RE | Admit: 2020-03-11 | Discharge: 2020-03-11 | Disposition: A | Discharge status: Home / Self Care | Payer: Medicare Other | Attending: Internal Medicine | Admitting: Internal Medicine

## 2020-03-11 ENCOUNTER — Encounter
Admission: RE | Disposition: A | Discharge status: Home / Self Care | Payer: Self-pay | Source: Home / Self Care | Attending: Internal Medicine

## 2020-03-11 ENCOUNTER — Ambulatory Visit: Payer: Medicare Other | Admitting: Anesthesiology

## 2020-03-11 ENCOUNTER — Encounter: Payer: Self-pay | Admitting: Internal Medicine

## 2020-03-11 DIAGNOSIS — I10 Essential (primary) hypertension: Secondary | ICD-10-CM | POA: Diagnosis not present

## 2020-03-11 DIAGNOSIS — K5730 Diverticulosis of large intestine without perforation or abscess without bleeding: Secondary | ICD-10-CM | POA: Insufficient documentation

## 2020-03-11 DIAGNOSIS — D123 Benign neoplasm of transverse colon: Secondary | ICD-10-CM | POA: Diagnosis not present

## 2020-03-11 DIAGNOSIS — K621 Rectal polyp: Secondary | ICD-10-CM | POA: Diagnosis not present

## 2020-03-11 DIAGNOSIS — J449 Chronic obstructive pulmonary disease, unspecified: Secondary | ICD-10-CM | POA: Insufficient documentation

## 2020-03-11 DIAGNOSIS — K641 Second degree hemorrhoids: Secondary | ICD-10-CM | POA: Insufficient documentation

## 2020-03-11 DIAGNOSIS — Z79899 Other long term (current) drug therapy: Secondary | ICD-10-CM | POA: Insufficient documentation

## 2020-03-11 DIAGNOSIS — R195 Other fecal abnormalities: Secondary | ICD-10-CM | POA: Insufficient documentation

## 2020-03-11 HISTORY — PX: COLONOSCOPY WITH PROPOFOL: SHX5780

## 2020-03-11 SURGERY — COLONOSCOPY WITH PROPOFOL
Anesthesia: General

## 2020-03-11 MED ORDER — PROPOFOL 10 MG/ML IV BOLUS
INTRAVENOUS | Status: DC | PRN
  Administered 2020-03-11: 125 ug/kg/min via INTRAVENOUS
  Administered 2020-03-11: 20 mg via INTRAVENOUS

## 2020-03-11 MED ORDER — PROPOFOL 500 MG/50ML IV EMUL
INTRAVENOUS | Status: AC
  Filled 2020-03-11: qty 50

## 2020-03-11 MED ORDER — LIDOCAINE HCL (PF) 2 % IJ SOLN
INTRAMUSCULAR | Status: DC | PRN
Start: 2020-03-11 — End: 2020-03-11
  Administered 2020-03-11: 40 mg via INTRADERMAL

## 2020-03-11 MED ORDER — SODIUM CHLORIDE 0.9 % IV SOLN
INTRAVENOUS | Status: DC
  Administered 2020-03-11: 1000 mL via INTRAVENOUS

## 2020-03-11 NOTE — Anesthesia Postprocedure Evaluation (Signed)
Anesthesia Post Note  Patient: Oscar Davis  Procedure(s) Performed: COLONOSCOPY WITH PROPOFOL (N/A )  Patient location during evaluation: Endoscopy Anesthesia Type: General Level of consciousness: awake and alert and oriented Pain management: pain level controlled Vital Signs Assessment: post-procedure vital signs reviewed and stable Respiratory status: spontaneous breathing, nonlabored ventilation and respiratory function stable Cardiovascular status: blood pressure returned to baseline and stable Postop Assessment: no signs of nausea or vomiting Anesthetic complications: no     Last Vitals:  Vitals:   03/11/20 1220 03/11/20 1230  BP: (!) 172/101 (!) 185/95  Pulse: 70 67  Resp: (!) 22 17  Temp:    SpO2: 100% 98%    Last Pain:  Vitals:   03/11/20 1230  TempSrc:   PainSc: 0-No pain                 Maxwell Martorano

## 2020-03-11 NOTE — Transfer of Care (Signed)
Immediate Anesthesia Transfer of Care Note  Patient: Winferd Colgin  Procedure(s) Performed: COLONOSCOPY WITH PROPOFOL (N/A )  Patient Location: PACU  Anesthesia Type:General  Level of Consciousness: drowsy  Airway & Oxygen Therapy: Patient Spontanous Breathing and Patient connected to nasal cannula oxygen  Post-op Assessment: Report given to RN and Post -op Vital signs reviewed and stable  Post vital signs: Reviewed and stable  Last Vitals:  Vitals Value Taken Time  BP    Temp    Pulse 78 03/11/20 1159  Resp 20 03/11/20 1159  SpO2 99 % 03/11/20 1159    Last Pain:  Vitals:   03/11/20 1109  TempSrc: Tympanic         Complications: No apparent anesthesia complications and Patient re-intubated

## 2020-03-11 NOTE — Op Note (Signed)
[facility] Gastroenterology Patient Name: Oscar Davis Procedure Date: 03/11/2020 11:18 AM MRN: [id number] Account #: [id number] Date of Birth: July 01, 1941 Admit Type: Outpatient Age: 79 Room: [facility] ENDO ROOM 3 Gender: Male Note Status: Finalized Procedure:             Colonoscopy Indications:           Heme positive stool Providers:             Benay Pike. Alice Reichert MD, MD Referring MD:          Venetia Maxon. Elijio Miles, MD (Referring MD) Medicines:             Propofol per Anesthesia Complications:         No immediate complications. Procedure:             Pre-Anesthesia Assessment:                        - The risks and benefits of the procedure and the                         sedation options and risks were discussed with the                         patient. All questions were answered and informed                         consent was obtained.                        - Patient identification and proposed procedure were                         verified prior to the procedure by the nurse. The                         procedure was verified in the procedure room.                        - ASA Grade Assessment: III - A patient with severe                         systemic disease.                        - After reviewing the risks and benefits, the patient                         was deemed in satisfactory condition to undergo the                         procedure.                        After obtaining informed consent, the colonoscope was                         passed under direct vision. Throughout the procedure,                         the patient's blood pressure, pulse, and  oxygen                         saturations were monitored continuously. The                         Colonoscope was introduced through the anus and                         advanced to the the cecum, identified by appendiceal                         orifice and ileocecal valve. The colonoscopy was                          performed without difficulty. The patient tolerated                         the procedure well. The quality of the bowel                         preparation was adequate. The ileocecal valve,                         appendiceal orifice, and rectum were photographed. Findings:      The perianal and digital rectal examinations were normal. Pertinent       negatives include normal sphincter tone and no palpable rectal lesions.      Non-bleeding internal hemorrhoids were found during retroflexion. The       hemorrhoids were Grade II (internal hemorrhoids that prolapse but reduce       spontaneously).      Multiple small and large-mouthed diverticula were found in the sigmoid       colon.      Two sessile polyps were found in the rectum. The polyps were 3 to 4 mm       in size. These polyps were removed with a jumbo cold forceps. Resection       and retrieval were complete.      Two pedunculated and sessile polyps were found in the transverse colon.       The polyps were 6 to 8 mm in size. These polyps were removed with a cold       snare. Resection and retrieval were complete. Estimated blood loss was       minimal.      The exam was otherwise without abnormality. Impression:            - Non-bleeding internal hemorrhoids.                        - Diverticulosis in the sigmoid colon.                        - Two 3 to 4 mm polyps in the rectum, removed with a                         jumbo cold forceps. Resected and retrieved.                        - Two 6 to 8 mm polyps in the transverse  colon,                         removed with a cold snare. Resected and retrieved.                        - The examination was otherwise normal. Recommendation:        - Patient has a contact number available for                         emergencies. The signs and symptoms of potential                         delayed complications were discussed with the patient.                          Return to normal activities tomorrow. Written                         discharge instructions were provided to the patient.                        - Resume previous diet.                        - Continue present medications.                        - Repeat colonoscopy is recommended for surveillance.                         The colonoscopy date will be determined after                         pathology results from today's exam become available                         for review.                        - Return to GI office PRN.                        - The findings and recommendations were discussed with                         the patient. Procedure Code(s):     --- Professional ---                        [phone], Colonoscopy, flexible; with removal of                         tumor(s), polyp(s), or other lesion(s) by snare                         technique                        [id number], 59, Colonoscopy, flexible; with biopsy, single  or multiple Diagnosis Code(s):     --- Professional ---                        K57.30, Diverticulosis of large intestine without                         perforation or abscess without bleeding                        R19.5, Other fecal abnormalities                        K63.5, Polyp of colon                        K62.1, Rectal polyp                        K64.1, Second degree hemorrhoids CPT copyright 2019 American Medical Association. All rights reserved. The codes documented in this report are preliminary and upon coder review may  be revised to meet current compliance requirements. Efrain Sella MD, MD 03/11/2020 11:58:16 AM This report has been signed electronically. Number of Addenda: 0 Note Initiated On: 03/11/2020 11:18 AM Scope Withdrawal Time: 0 hours 7 minutes 32 seconds  Total Procedure Duration: 0 hours 15 minutes 8 seconds  Estimated Blood Loss:  Estimated blood loss was minimal.      [facility]

## 2020-03-11 NOTE — H&P (Signed)
Outpatient short stay form Pre-procedure 03/11/2020 10:29 AM Oscar Davis Oscar Davis, M.D.  Primary Physician: Oscar Davis, M.D.  Reason for visit:  Heme positive stool  History of present illness:  Patient presents for colonoscopy for finding of a hemoccult positive stool. Patient denies change in bowel habits, rectal bleeding, weight loss or abdominal pain.      No current facility-administered medications for this encounter.  Current Outpatient Medications:  .  albuterol (PROVENTIL HFA;VENTOLIN HFA) 108 (90 BASE) MCG/ACT inhaler, Inhale 2 puffs into the lungs every 6 (six) hours as needed for wheezing or shortness of breath., Disp: , Rfl:  .  amLODipine (NORVASC) 5 MG tablet, Take 5 mg by mouth daily., Disp: , Rfl:  .  azithromycin (ZITHROMAX) 250 MG tablet, As directed for 5 days, Disp: 6 tablet, Rfl: 0 .  chlorthalidone (HYGROTON) 25 MG tablet, Take 25 mg by mouth daily., Disp: , Rfl:  .  Fluticasone-Salmeterol (ADVAIR) 250-50 MCG/DOSE AEPB, Inhale 1 puff into the lungs 2 (two) times daily., Disp: , Rfl:  .  predniSONE (DELTASONE) 20 MG tablet, Take 3 tablets (60 mg total) by mouth daily., Disp: 15 tablet, Rfl: 0 .  sulfamethoxazole-trimethoprim (BACTRIM DS,SEPTRA DS) 800-160 MG tablet, Take 1 tablet by mouth once., Disp: 20 tablet, Rfl: 0  No medications prior to admission.     No Known Allergies   Past Medical History:  Diagnosis Date  . COPD (chronic obstructive pulmonary disease) ([place])   . Hypertension     Review of systems:  Otherwise negative.    Physical Exam  Gen: Alert, oriented. Appears stated age.  HEENT: [place]/AT. PERRLA. Lungs: CTA, no wheezes. CV: RR nl S1, S2. Abd: soft, benign, no masses. BS+ Ext: No edema. Pulses 2+    Planned procedures: Proceed with colonoscopy. The patient understands the nature of the planned procedure, indications, risks, alternatives and potential complications including but not limited to bleeding, infection, perforation,  damage to internal organs and possible oversedation/side effects from anesthesia. The patient agrees and gives consent to proceed.  Please refer to procedure notes for findings, recommendations and patient disposition/instructions.     [name] Oscar Davis, M.D. Gastroenterology 03/11/2020  10:29 AM

## 2020-03-11 NOTE — Anesthesia Preprocedure Evaluation (Signed)
Anesthesia Evaluation  Patient identified by MRN, date of birth, ID band Patient awake    Reviewed: Allergy & Precautions, NPO status , Patient's Chart, lab work & pertinent test results  History of Anesthesia Complications Negative for: history of anesthetic complications  Airway Mallampati: II  TM Distance: >3 FB Neck ROM: Full    Dental  (+) Lower Dentures, Upper Dentures   Pulmonary neg sleep apnea, COPD,  COPD inhaler,    breath sounds clear to auscultation- rhonchi (-) wheezing      Cardiovascular hypertension, Pt. on medications (-) CAD, (-) Past MI, (-) Cardiac Stents and (-) CABG  Rhythm:Regular Rate:Normal - Systolic murmurs and - Diastolic murmurs    Neuro/Psych neg Seizures negative neurological ROS  negative psych ROS   GI/Hepatic negative GI ROS, Neg liver ROS,   Endo/Other  negative endocrine ROSneg diabetes  Renal/GU negative Renal ROS     Musculoskeletal negative musculoskeletal ROS (+)   Abdominal (+) - obese,   Peds  Hematology negative hematology ROS (+)   Anesthesia Other Findings Past Medical History: No date: COPD (chronic obstructive pulmonary disease) (HCC) No date: Hypertension   Reproductive/Obstetrics                            Anesthesia Physical Anesthesia Plan  ASA: II  Anesthesia Plan: General   Post-op Pain Management:    Induction: Intravenous  PONV Risk Score and Plan: 1 and Propofol infusion  Airway Management Planned: Natural Airway  Additional Equipment:   Intra-op Plan:   Post-operative Plan:   Informed Consent: I have reviewed the patients History and Physical, chart, labs and discussed the procedure including the risks, benefits and alternatives for the proposed anesthesia with the patient or authorized representative who has indicated his/her understanding and acceptance.     Dental advisory given  Plan Discussed with: CRNA and  Anesthesiologist  Anesthesia Plan Comments:         Anesthesia Quick Evaluation

## 2020-03-12 ENCOUNTER — Encounter: Payer: Self-pay | Admitting: *Deleted

## 2020-03-12 LAB — SURGICAL PATHOLOGY

## 2020-07-05 ENCOUNTER — Other Ambulatory Visit: Payer: Self-pay

## 2020-07-05 ENCOUNTER — Encounter: Payer: Medicare Other | Attending: Internal Medicine | Admitting: *Deleted

## 2020-07-05 ENCOUNTER — Encounter: Payer: Self-pay | Admitting: *Deleted

## 2020-07-05 VITALS — BP 124/70 | Ht 70.0 in | Wt 190.5 lb

## 2020-07-05 DIAGNOSIS — E118 Type 2 diabetes mellitus with unspecified complications: Secondary | ICD-10-CM | POA: Insufficient documentation

## 2020-07-05 DIAGNOSIS — E119 Type 2 diabetes mellitus without complications: Secondary | ICD-10-CM

## 2020-07-05 NOTE — Patient Instructions (Addendum)
Exercise: Continue walking for 60  minutes  7 days a week  Eat 3 meals day, 2-3  snacks a day Space meals 4-6 hours apart Allow 2-3 hours between meals and snacks Avoid sugar sweetened drinks (juices) and decrease using honey as a sweettener  Complete 3 Day Food Record and bring to next appt   Bring meter next week for instruction - Tuesday July 09, 2020 at 9:00 am   Return for appointment on:  Friday August 09, 2020 at 9:00 am with Freda Munro (nurse)

## 2020-07-05 NOTE — Progress Notes (Signed)
Diabetes Self-Management Education  Visit Type: First/Initial  Appt. Start Time: 0855 Appt. End Time: 8144  07/05/2020  Mr. [facility], identified by name and date of birth, is a 79 y.o. male with a diagnosis of Diabetes: Type 2.   ASSESSMENT  Blood pressure 124/70, height 5\' 10"  (1.778 m), weight 190 lb 8 oz (86.4 kg). Body mass index is 27.33 kg/m.   Diabetes Self-Management Education - 07/05/20 1048      Visit Information   Visit Type First/Initial      Initial Visit   Diabetes Type Type 2    Are you currently following a meal plan? Yes    What type of meal plan do you follow? "eliminated soda and sweets"    Are you taking your medications as prescribed? Yes    Date Diagnosed "not sure" Note from MD office with diagnosis was 06/05/2020 - no A1C sent from MD office and no EPIC lab      Health Coping   How would you rate your overall health? Excellent      Psychosocial Assessment   Patient Belief/Attitude about Diabetes Other (comment)   "don't claim it, wasn't aware I had it"   Self-care barriers None    Self-management support Doctor's office;Family    Other persons present Spouse/SO    Patient Concerns Nutrition/Meal planning;Glycemic Control;Medication;Monitoring;Healthy Lifestyle    Special Needs None    Preferred Learning Style Auditory    Learning Readiness Change in progress    How often do you need to have someone help you when you read instructions, pamphlets, or other written materials from your doctor or pharmacy? 1 - Never      Pre-Education Assessment   Patient understands the diabetes disease and treatment process. Needs Instruction    Patient understands incorporating nutritional management into lifestyle. Needs Instruction    Patient undertands incorporating physical activity into lifestyle. Needs Instruction    Patient understands using medications safely. Needs Instruction    Patient understands monitoring blood glucose, interpreting and using  results Needs Instruction    Patient understands prevention, detection, and treatment of acute complications. Needs Instruction    Patient understands prevention, detection, and treatment of chronic complications. Needs Instruction    Patient understands how to develop strategies to address psychosocial issues. Needs Instruction    Patient understands how to develop strategies to promote health/change behavior. Needs Instruction      Complications   How often do you check your blood sugar? 0 times/day (not testing)   Pt has a meter but didn't bring to this appointment. He will bring next week for instruction. BG in the office today was 100 mg at 10:00 am - 2 hrs pp.   Have you had a dilated eye exam in the past 12 months? Yes    Have you had a dental exam in the past 12 months? No    Are you checking your feet? No      Dietary Intake   Breakfast chicken sausage with spinach, whole wheat low carb bread; fish and spinach - like stir fry with olive oil in a wrap    Snack (morning) 2-3 snacks/day - nuts, fruit (watermelon, pears, grapes, oranges)    Lunch graham crackers and peanut butter    Dinner eats fish every day - sometimes breakfast and supper - salmon; [place], occasional beef and chicken; peas, beans, corn, rice, broccoli, turnip and mustard greens, asparagus, squash, lettuce, tomatoes, cucumbers, peppers    Beverage(s) water, fruit juice and  juice blends; uses Honey to sweetened drinks      Exercise   Exercise Type Light (walking / raking leaves)    How many days per week to you exercise? 7    How many minutes per day do you exercise? 60    Total minutes per week of exercise 420      Patient Education   Previous Diabetes Education No    Disease state  Definition of diabetes, type 1 and 2, and the diagnosis of diabetes;Factors that contribute to the development of diabetes   Pt has been on steroids for his COPD   Nutrition management  Role of diet in the treatment of diabetes and the  relationship between the three main macronutrients and blood glucose level;Food label reading, portion sizes and measuring food.;Reviewed blood glucose goals for pre and post meals and how to evaluate the patients' food intake on their blood glucose level.    Physical activity and exercise  Role of exercise on diabetes management, blood pressure control and cardiac health.    Medications Reviewed patients medication for diabetes, action, purpose, timing of dose and side effects.    Monitoring Purpose and frequency of SMBG.;Taught/discussed recording of test results and interpretation of SMBG.;Identified appropriate SMBG and/or A1C goals.    Chronic complications Relationship between chronic complications and blood glucose control    Psychosocial adjustment Identified and addressed patients feelings and concerns about diabetes      Individualized Goals (developed by patient)   Reducing Risk Other (comment)   improve blood sugars, decrease medications, prevent diabetes complications, lead a healthier lifestyle     Outcomes   Expected Outcomes Demonstrated interest in learning. Expect positive outcomes    Future DMSE 4-6 wks           Individualized Plan for Diabetes Self-Management Training:   Learning Objective:  Patient will have a greater understanding of diabetes self-management. Patient education plan is to attend individual and/or group sessions per assessed needs and concerns.   Plan:   Patient Instructions  Exercise: Continue walking for 60  minutes  7 days a week Eat 3 meals day, 2-3  snacks a day Space meals 4-6 hours apart Allow 2-3 hours between meals and snacks Avoid sugar sweetened drinks (juices) and decrease using honey as a sweetener Complete 3 Day Food Record and bring to next appt  Bring meter next week for instruction - Tuesday July 09, 2020 at 9:00 am  Return for appointment on:  Friday August 09, 2020 at 9:00 am with Freda Munro (nurse)  Expected Outcomes:   Demonstrated interest in learning. Expect positive outcomes  Education material provided:  General Meal Planning Guidelines Simple Meal Plan 3 Day Food Record  If problems or questions, patient to contact team via:  Johny Drilling, RN, [place] [phone]  Future DSME appointment: 4-6 wks  September 21 for meter instruction and August 09, 2020 with this nurse

## 2020-07-09 ENCOUNTER — Encounter: Payer: Self-pay | Admitting: *Deleted

## 2020-07-09 NOTE — Progress Notes (Signed)
Patient brought his meter One Touch Verio Reflect for instruction. BG upon return demonstration was 101 mg/dL at 9:15 am - 1 1/2 hrs pp. Instructed him to check fasting and 2 hrs after supper but only 2-3 x week. He brought his Food Record and will bring his BG records to next appointment in October.

## 2020-08-09 ENCOUNTER — Other Ambulatory Visit: Payer: Self-pay

## 2020-08-09 ENCOUNTER — Encounter: Payer: Medicare Other | Attending: Internal Medicine | Admitting: *Deleted

## 2020-08-09 ENCOUNTER — Encounter: Payer: Self-pay | Admitting: *Deleted

## 2020-08-09 VITALS — BP 138/80 | Wt 183.6 lb

## 2020-08-09 DIAGNOSIS — Z713 Dietary counseling and surveillance: Secondary | ICD-10-CM | POA: Diagnosis present

## 2020-08-09 DIAGNOSIS — E119 Type 2 diabetes mellitus without complications: Secondary | ICD-10-CM | POA: Insufficient documentation

## 2020-08-09 NOTE — Progress Notes (Signed)
Diabetes Self-Management Education  Visit Type: Follow-up  Appt. Start HENI:7782  Appt. End Time: 0930  08/09/2020  Mr. [facility], identified by name and date of birth, is a 79 y.o. male with a diagnosis of Diabetes: Type 2   ASSESSMENT  Blood pressure 138/80, weight 183 lb 9.6 oz (83.3 kg). Body mass index is 26.34 kg/m.   Diabetes Self-Management Education - 08/09/20 0900      Visit Information   Visit Type Follow-up      Complications   Last HgB A1C per patient/outside source 5.5 %   Sept by nurse from insurance plan   How often do you check your blood sugar? 3-4 times / week    Postprandial Blood glucose range (mg/dL) 70-129;130-179   pp's 77-138 mg/dL    Have you had a dilated eye exam in the past 12 months? Yes    Have you had a dental exam in the past 12 months? No    Are you checking your feet? No      Dietary Intake   Breakfast 3 meals and 2-3 snacks/day   see 3 Day Food Record     Exercise   Exercise Type Light (walking / raking leaves)    How many days per week to you exercise? 7    How many minutes per day do you exercise? 60    Total minutes per week of exercise 420      Patient Education   Disease state  Factors that contribute to the development of diabetes    Nutrition management  Role of diet in the treatment of diabetes and the relationship between the three main macronutrients and blood glucose level;Food label reading, portion sizes and measuring food.;Reviewed blood glucose goals for pre and post meals and how to evaluate the patients' food intake on their blood glucose level.    Physical activity and exercise  Role of exercise on diabetes management, blood pressure control and cardiac health.    Medications Reviewed patients medication for diabetes, action, purpose, timing of dose and side effects.    Monitoring Purpose and frequency of SMBG.;Identified appropriate SMBG and/or A1C goals.;Taught/discussed recording of test results and  interpretation of SMBG.    Chronic complications Relationship between chronic complications and blood glucose control    Psychosocial adjustment Identified and addressed patients feelings and concerns about diabetes      Post-Education Assessment   Patient understands the diabetes disease and treatment process. Needs Review    Patient understands incorporating nutritional management into lifestyle. Demonstrates understanding / competency    Patient undertands incorporating physical activity into lifestyle. Demonstrates understanding / competency    Patient understands using medications safely. Demonstrates understanding / competency    Patient understands monitoring blood glucose, interpreting and using results Demonstrates understanding / competency    Patient understands prevention, detection, and treatment of acute complications. Needs Instruction    Patient understands prevention, detection, and treatment of chronic complications. Demonstrates understanding / competency    Patient understands how to develop strategies to address psychosocial issues. Demonstrates understanding / competency    Patient understands how to develop strategies to promote health/change behavior. Demonstrates understanding / competency      Outcomes   Expected Outcomes Demonstrated interest in learning. Expect positive outcomes    Future DMSE PRN    Program Status Completed      Subsequent Visit   Since your last visit have you continued or begun to take your medications as prescribed? Yes  Since your last visit have you had your blood pressure checked? Yes    Is your most recent blood pressure lower, unchanged, or higher since your last visit? Unchanged    Since your last visit have you experienced any weight changes? Loss    Weight Loss (lbs) 6.9    Since your last visit, are you checking your blood glucose at least once a day? N/A           Individualized Plan for Diabetes Self-Management Training:    Learning Objective:  Patient will have a greater understanding of diabetes self-management. Patient education plan is to attend individual and/or group sessions per assessed needs and concerns.   Plan:   Patient Instructions  Check blood sugars 1 x day before breakfast or 2 hrs after one meal 3 x week Bring blood sugar records to the next MD appointment Exercise: Continue walking for 60 minutes  7  days a week Eat 3 meals day,  2-3  snacks a day Space meals 4-6 hours apart Continue to avoid sugar sweetened drinks and limit use of honey as sweetener Call back for any questions  Expected Outcomes:  Demonstrated interest in learning. Expect positive outcomes  Education material provided:  Planning a Balanced Meal How to Thrive: A Guide for Your Journey with Diabetes  If problems or questions, patient to contact team via:  Johny Drilling, RN, [place], [place] [phone]  Future DSME appointment: PRN

## 2020-08-09 NOTE — Patient Instructions (Signed)
Check blood sugars 1 x day before breakfast or 2 hrs after one meal 3 x week Bring blood sugar records to the next MD appointment  Exercise: Continue walking for 60 minutes  7  days a week  Eat 3 meals day,  2-3  snacks a day Space meals 4-6 hours apart Continue to avoid sugar sweetened drinks and limit use of honey as sweetener  Call back for any questions

## 2020-10-17 ENCOUNTER — Other Ambulatory Visit: Payer: Self-pay | Admitting: Internal Medicine

## 2020-11-27 ENCOUNTER — Ambulatory Visit: Payer: Self-pay | Admitting: Urology

## 2021-01-22 DIAGNOSIS — Z01818 Encounter for other preprocedural examination: Secondary | ICD-10-CM | POA: Diagnosis not present

## 2021-01-22 DIAGNOSIS — I10 Essential (primary) hypertension: Secondary | ICD-10-CM | POA: Diagnosis not present

## 2021-01-22 DIAGNOSIS — Z01812 Encounter for preprocedural laboratory examination: Secondary | ICD-10-CM | POA: Diagnosis not present

## 2021-01-22 DIAGNOSIS — Z20822 Contact with and (suspected) exposure to covid-19: Secondary | ICD-10-CM | POA: Diagnosis not present

## 2021-01-22 DIAGNOSIS — J449 Chronic obstructive pulmonary disease, unspecified: Secondary | ICD-10-CM | POA: Diagnosis not present

## 2021-01-22 DIAGNOSIS — J45909 Unspecified asthma, uncomplicated: Secondary | ICD-10-CM | POA: Diagnosis not present

## 2021-01-24 DIAGNOSIS — E119 Type 2 diabetes mellitus without complications: Secondary | ICD-10-CM | POA: Diagnosis not present

## 2021-01-24 DIAGNOSIS — J449 Chronic obstructive pulmonary disease, unspecified: Secondary | ICD-10-CM | POA: Diagnosis not present

## 2021-01-24 DIAGNOSIS — J329 Chronic sinusitis, unspecified: Secondary | ICD-10-CM | POA: Diagnosis not present

## 2021-01-24 DIAGNOSIS — J339 Nasal polyp, unspecified: Secondary | ICD-10-CM | POA: Diagnosis not present

## 2021-01-24 DIAGNOSIS — J310 Chronic rhinitis: Secondary | ICD-10-CM | POA: Diagnosis not present

## 2021-01-24 DIAGNOSIS — I10 Essential (primary) hypertension: Secondary | ICD-10-CM | POA: Diagnosis not present

## 2021-01-24 DIAGNOSIS — J324 Chronic pansinusitis: Secondary | ICD-10-CM | POA: Diagnosis not present

## 2021-01-24 DIAGNOSIS — Z79899 Other long term (current) drug therapy: Secondary | ICD-10-CM | POA: Diagnosis not present

## 2021-01-25 DIAGNOSIS — J310 Chronic rhinitis: Secondary | ICD-10-CM | POA: Diagnosis not present

## 2021-01-25 DIAGNOSIS — J449 Chronic obstructive pulmonary disease, unspecified: Secondary | ICD-10-CM | POA: Diagnosis not present

## 2021-01-25 DIAGNOSIS — J329 Chronic sinusitis, unspecified: Secondary | ICD-10-CM | POA: Diagnosis not present

## 2021-01-25 DIAGNOSIS — Z79899 Other long term (current) drug therapy: Secondary | ICD-10-CM | POA: Diagnosis not present

## 2021-01-25 DIAGNOSIS — J339 Nasal polyp, unspecified: Secondary | ICD-10-CM | POA: Diagnosis not present

## 2021-01-25 DIAGNOSIS — I10 Essential (primary) hypertension: Secondary | ICD-10-CM | POA: Diagnosis not present

## 2021-01-25 DIAGNOSIS — E119 Type 2 diabetes mellitus without complications: Secondary | ICD-10-CM | POA: Diagnosis not present

## 2021-01-28 DIAGNOSIS — J310 Chronic rhinitis: Secondary | ICD-10-CM | POA: Diagnosis not present

## 2021-01-28 DIAGNOSIS — J339 Nasal polyp, unspecified: Secondary | ICD-10-CM | POA: Diagnosis not present

## 2021-02-11 DIAGNOSIS — J310 Chronic rhinitis: Secondary | ICD-10-CM | POA: Diagnosis not present

## 2021-03-11 DIAGNOSIS — J310 Chronic rhinitis: Secondary | ICD-10-CM | POA: Diagnosis not present

## 2021-03-11 DIAGNOSIS — J329 Chronic sinusitis, unspecified: Secondary | ICD-10-CM | POA: Diagnosis not present

## 2021-04-08 DIAGNOSIS — E119 Type 2 diabetes mellitus without complications: Secondary | ICD-10-CM | POA: Diagnosis not present

## 2021-04-09 DIAGNOSIS — M5412 Radiculopathy, cervical region: Secondary | ICD-10-CM | POA: Diagnosis not present

## 2021-04-09 DIAGNOSIS — E119 Type 2 diabetes mellitus without complications: Secondary | ICD-10-CM | POA: Diagnosis not present

## 2021-04-09 DIAGNOSIS — I10 Essential (primary) hypertension: Secondary | ICD-10-CM | POA: Diagnosis not present

## 2021-04-09 DIAGNOSIS — J61 Pneumoconiosis due to asbestos and other mineral fibers: Secondary | ICD-10-CM | POA: Diagnosis not present

## 2021-04-09 DIAGNOSIS — J301 Allergic rhinitis due to pollen: Secondary | ICD-10-CM | POA: Diagnosis not present

## 2021-04-09 DIAGNOSIS — M67432 Ganglion, left wrist: Secondary | ICD-10-CM | POA: Diagnosis not present

## 2021-05-17 ENCOUNTER — Encounter: Payer: Self-pay | Admitting: Emergency Medicine

## 2021-05-17 ENCOUNTER — Emergency Department: Payer: Medicare Other

## 2021-05-17 ENCOUNTER — Other Ambulatory Visit: Payer: Self-pay

## 2021-05-17 ENCOUNTER — Emergency Department
Admission: EM | Admit: 2021-05-17 | Discharge: 2021-05-17 | Disposition: A | Discharge status: Home / Self Care | Payer: Medicare Other | Attending: Emergency Medicine | Admitting: Emergency Medicine

## 2021-05-17 DIAGNOSIS — I10 Essential (primary) hypertension: Secondary | ICD-10-CM | POA: Insufficient documentation

## 2021-05-17 DIAGNOSIS — Z79899 Other long term (current) drug therapy: Secondary | ICD-10-CM | POA: Insufficient documentation

## 2021-05-17 DIAGNOSIS — S99911A Unspecified injury of right ankle, initial encounter: Secondary | ICD-10-CM | POA: Diagnosis not present

## 2021-05-17 DIAGNOSIS — J449 Chronic obstructive pulmonary disease, unspecified: Secondary | ICD-10-CM | POA: Diagnosis not present

## 2021-05-17 DIAGNOSIS — Z7984 Long term (current) use of oral hypoglycemic drugs: Secondary | ICD-10-CM | POA: Insufficient documentation

## 2021-05-17 DIAGNOSIS — E119 Type 2 diabetes mellitus without complications: Secondary | ICD-10-CM | POA: Insufficient documentation

## 2021-05-17 DIAGNOSIS — S93401A Sprain of unspecified ligament of right ankle, initial encounter: Secondary | ICD-10-CM

## 2021-05-17 DIAGNOSIS — Z7951 Long term (current) use of inhaled steroids: Secondary | ICD-10-CM | POA: Insufficient documentation

## 2021-05-17 DIAGNOSIS — M7731 Calcaneal spur, right foot: Secondary | ICD-10-CM | POA: Diagnosis not present

## 2021-05-17 DIAGNOSIS — X501XXA Overexertion from prolonged static or awkward postures, initial encounter: Secondary | ICD-10-CM | POA: Diagnosis not present

## 2021-05-17 NOTE — Discharge Instructions (Addendum)
Follow-up with your primary care provider if any continued problems with your ankle.  An Ace wrap was applied to your ankle to give you support and protection.  Ice and elevation to help with any pain or swelling.  You may take Tylenol twice a day with food if pain medication is needed.

## 2021-05-17 NOTE — ED Provider Notes (Signed)
[facility] Emergency Department Provider Note   ____________________________________________   Event Date/Time   First MD Initiated Contact with Patient 05/17/21 [phone]     (approximate)  I have reviewed the triage vital signs and the nursing notes.   HISTORY  Chief Complaint Ankle Pain   HPI Oscar Davis is a 80 y.o. male sent to the ED with complaint of right ankle pain after he twisted it 2 days ago.  Patient reports that he did not fall.  It has been swollen and painful to walk on since that happened.  Patient has been ambulatory without any assistance since he twisted his ankle.      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 Authorizing Provider  albuterol (PROVENTIL HFA;VENTOLIN HFA) 108 (90 BASE) MCG/ACT inhaler Inhale 2 puffs into the lungs every 6 (six) hours as needed for wheezing or shortness of breath.    [provider]  amLODipine (NORVASC) 10 MG tablet Take 10 mg by mouth daily.    [provider]  cetirizine (ZYRTEC) 10 MG tablet Take 1 tablet by mouth as needed. 04/19/19   [provider]  chlorthalidone (HYGROTON) 25 MG tablet Take 25 mg by mouth daily.    [provider]  fluticasone (FLONASE) 50 MCG/ACT nasal spray Place 2 sprays into both  nostrils daily as needed. 11/16/19   [provider]  Fluticasone-Salmeterol (ADVAIR) 250-50 MCG/DOSE AEPB Inhale 1 puff into the lungs 2 (two) times daily.    [provider]  meloxicam (MOBIC) 15 MG tablet Take 15 mg by mouth daily. 04/25/20   [provider]  metFORMIN (GLUCOPHAGE) 500 MG tablet Take 500 mg by mouth 2 (two) times daily. 06/05/20   [provider]  sildenafil (VIAGRA) 100 MG tablet SMARTSIG:0.5 Tablet(s) By Mouth Every 4 Hours 02/21/20   [provider]    Allergies Prednisone  Family History  Problem Relation Age of Onset   Alcoholism Father     Social History Social History   Tobacco Use   Smoking status: Never   Smokeless tobacco: Never  Substance Use Topics   Alcohol use: No   Drug use: No    Review of Systems Constitutional: No fever/chills Eyes: No visual changes. Cardiovascular: Denies chest pain. Respiratory: Denies shortness of breath. Gastrointestinal: No abdominal pain.  No nausea, no vomiting.   Musculoskeletal: Positive for right ankle pain. Skin: Negative for rash. Neurological: Negative for headaches, focal weakness or numbness.  ____________________________________________   PHYSICAL EXAM:  VITAL SIGNS: ED Triage Vitals  Enc Vitals Group     BP 05/17/21 0923 124/75     Pulse Rate 05/17/21 0923 72     Resp 05/17/21 0923 20     Temp 05/17/21 0923 98.4 F (36.9 C)     Temp Source 05/17/21 [phone]  Oral     SpO2 05/17/21 0923 95 %     Weight 05/17/21 0918 183 lb (83 kg)     Height 05/17/21 0918 '5\' 10"'$  (1.778 m)     Head Circumference --      Peak Flow --      Pain Score 05/17/21 0918 8     Pain Loc --      Pain Edu? --      Excl. in [place]? --     Constitutional: Alert and oriented. Well appearing and in no acute distress. Eyes: Conjunctivae are normal. PERRL. EOMI. Head: Atraumatic. Neck: No stridor.   Cardiovascular: Normal rate, regular rhythm. Grossly normal heart sounds.  Good  peripheral circulation. Respiratory: Normal respiratory effort.  No retractions. Lungs CTAB. Gastrointestinal: Soft and nontender. No distention. Musculoskeletal: Examination of the right ankle there is minimal soft tissue edema present.  There is generalized tenderness on palpation of the ankle but no gross deformity is noted.  Skin is intact.  Capillary fill is less than 3 seconds however great toe is difficult to assess secondary to fungal infection. Neurologic:  Normal speech and language. No gross focal neurologic deficits are appreciated.  Skin:  Skin is warm, dry and intact. No rash noted. Psychiatric: Mood and affect are normal. Speech and behavior are normal.  ____________________________________________   LABS (all labs ordered are listed, but only abnormal results are displayed)  Labs Reviewed - No data to display ____________________________________________ ____________________________________________  RADIOLOGY Leana Gamer, personally viewed and evaluated these images (plain radiographs) as part of my medical decision making, as well as reviewing the written report by the radiologist.   Official radiology report(s): DG Ankle Complete Right  Result Date: 05/17/2021 CLINICAL DATA:  Twisted right ankle 2 days ago. EXAM: RIGHT ANKLE - COMPLETE 3+ VIEW COMPARISON:  None. FINDINGS: There is no evidence of fracture, dislocation, or joint effusion. There is no evidence of arthropathy or other focal bone abnormality. Small heel spurs. Soft tissues are unremarkable. IMPRESSION: 1. No acute findings. 2. Heel spurs. Electronically Signed   By: Kerby Moors M.D.   On: 05/17/2021 10:31    ____________________________________________   PROCEDURES  Procedure(s) performed (including Critical Care):  Procedures   ____________________________________________   INITIAL IMPRESSION / ASSESSMENT AND PLAN / ED COURSE  As part of my medical decision making, I reviewed the  following data within the electronic MEDICAL RECORD NUMBER Notes from prior ED visits and [place] Controlled Substance Database  80 year old male presents to the ED with complaint of continued right ankle pain since he twisted his ankle 2 days ago.  Patient did not fall and is continued to be ambulatory since that time.  X-rays were negative for acute bony injury.  There is some minimal soft tissue edema and tenderness.  Patient is neurovascular intact.  Ace wrap was applied and patient was ambulatory at the time of discharge to follow-up with his primary care provider and elevate as needed for swelling.   ____________________________________________   FINAL CLINICAL IMPRESSION(S) / ED DIAGNOSES  Final diagnoses:  Sprain of right ankle, unspecified ligament, initial encounter     ED Discharge Orders     None        Note:  This document was prepared using Dragon voice recognition software and may include unintentional dictation errors.    Johnn Hai, PA-C 05/17/21 1133    Lavonia Drafts, MD 05/17/21 1328

## 2021-05-17 NOTE — ED Triage Notes (Signed)
Pt reports twisted his right ankle coming down the stairs 2 days ago and now swollen and painful to walk on

## 2021-06-04 DIAGNOSIS — J310 Chronic rhinitis: Secondary | ICD-10-CM | POA: Diagnosis not present

## 2021-06-04 DIAGNOSIS — J329 Chronic sinusitis, unspecified: Secondary | ICD-10-CM | POA: Diagnosis not present

## 2021-07-23 DIAGNOSIS — M5412 Radiculopathy, cervical region: Secondary | ICD-10-CM | POA: Diagnosis not present

## 2021-07-23 DIAGNOSIS — I10 Essential (primary) hypertension: Secondary | ICD-10-CM | POA: Diagnosis not present

## 2021-07-23 DIAGNOSIS — J301 Allergic rhinitis due to pollen: Secondary | ICD-10-CM | POA: Diagnosis not present

## 2021-07-23 DIAGNOSIS — E119 Type 2 diabetes mellitus without complications: Secondary | ICD-10-CM | POA: Diagnosis not present

## 2021-07-23 DIAGNOSIS — M67432 Ganglion, left wrist: Secondary | ICD-10-CM | POA: Diagnosis not present

## 2021-08-04 DIAGNOSIS — E119 Type 2 diabetes mellitus without complications: Secondary | ICD-10-CM | POA: Diagnosis not present

## 2021-08-06 DIAGNOSIS — M67432 Ganglion, left wrist: Secondary | ICD-10-CM | POA: Diagnosis not present

## 2021-08-06 DIAGNOSIS — E119 Type 2 diabetes mellitus without complications: Secondary | ICD-10-CM | POA: Diagnosis not present

## 2021-08-06 DIAGNOSIS — I10 Essential (primary) hypertension: Secondary | ICD-10-CM | POA: Diagnosis not present

## 2021-08-06 DIAGNOSIS — M5412 Radiculopathy, cervical region: Secondary | ICD-10-CM | POA: Diagnosis not present

## 2021-10-22 DIAGNOSIS — Z0001 Encounter for general adult medical examination with abnormal findings: Secondary | ICD-10-CM | POA: Diagnosis not present

## 2021-10-22 DIAGNOSIS — E119 Type 2 diabetes mellitus without complications: Secondary | ICD-10-CM | POA: Diagnosis not present

## 2021-10-22 DIAGNOSIS — G3184 Mild cognitive impairment, so stated: Secondary | ICD-10-CM | POA: Diagnosis not present

## 2021-10-22 DIAGNOSIS — J45998 Other asthma: Secondary | ICD-10-CM | POA: Diagnosis not present

## 2021-10-22 DIAGNOSIS — M5412 Radiculopathy, cervical region: Secondary | ICD-10-CM | POA: Diagnosis not present

## 2021-10-22 DIAGNOSIS — I10 Essential (primary) hypertension: Secondary | ICD-10-CM | POA: Diagnosis not present

## 2021-10-22 DIAGNOSIS — M67432 Ganglion, left wrist: Secondary | ICD-10-CM | POA: Diagnosis not present

## 2021-10-22 DIAGNOSIS — J61 Pneumoconiosis due to asbestos and other mineral fibers: Secondary | ICD-10-CM | POA: Diagnosis not present

## 2021-11-26 DIAGNOSIS — M79671 Pain in right foot: Secondary | ICD-10-CM | POA: Diagnosis not present

## 2021-11-26 DIAGNOSIS — M2021 Hallux rigidus, right foot: Secondary | ICD-10-CM | POA: Diagnosis not present

## 2022-03-18 DIAGNOSIS — E119 Type 2 diabetes mellitus without complications: Secondary | ICD-10-CM | POA: Diagnosis not present

## 2022-03-18 DIAGNOSIS — I10 Essential (primary) hypertension: Secondary | ICD-10-CM | POA: Diagnosis not present

## 2022-03-25 DIAGNOSIS — M5412 Radiculopathy, cervical region: Secondary | ICD-10-CM | POA: Diagnosis not present

## 2022-03-25 DIAGNOSIS — G3184 Mild cognitive impairment, so stated: Secondary | ICD-10-CM | POA: Diagnosis not present

## 2022-03-25 DIAGNOSIS — E119 Type 2 diabetes mellitus without complications: Secondary | ICD-10-CM | POA: Diagnosis not present

## 2022-03-25 DIAGNOSIS — M67432 Ganglion, left wrist: Secondary | ICD-10-CM | POA: Diagnosis not present

## 2022-03-25 DIAGNOSIS — J301 Allergic rhinitis due to pollen: Secondary | ICD-10-CM | POA: Diagnosis not present

## 2022-03-25 DIAGNOSIS — I10 Essential (primary) hypertension: Secondary | ICD-10-CM | POA: Diagnosis not present

## 2022-08-05 DIAGNOSIS — I10 Essential (primary) hypertension: Secondary | ICD-10-CM | POA: Diagnosis not present

## 2022-08-05 DIAGNOSIS — E119 Type 2 diabetes mellitus without complications: Secondary | ICD-10-CM | POA: Diagnosis not present

## 2022-08-05 DIAGNOSIS — M5412 Radiculopathy, cervical region: Secondary | ICD-10-CM | POA: Diagnosis not present

## 2022-08-05 DIAGNOSIS — K4091 Unilateral inguinal hernia, without obstruction or gangrene, recurrent: Secondary | ICD-10-CM | POA: Diagnosis not present

## 2022-08-05 DIAGNOSIS — G3184 Mild cognitive impairment, so stated: Secondary | ICD-10-CM | POA: Diagnosis not present

## 2022-08-05 DIAGNOSIS — J61 Pneumoconiosis due to asbestos and other mineral fibers: Secondary | ICD-10-CM | POA: Diagnosis not present

## 2022-08-05 DIAGNOSIS — M67432 Ganglion, left wrist: Secondary | ICD-10-CM | POA: Diagnosis not present

## 2022-10-01 DIAGNOSIS — K4090 Unilateral inguinal hernia, without obstruction or gangrene, not specified as recurrent: Secondary | ICD-10-CM | POA: Diagnosis not present

## 2022-10-01 DIAGNOSIS — I10 Essential (primary) hypertension: Secondary | ICD-10-CM | POA: Diagnosis not present

## 2022-10-01 DIAGNOSIS — E119 Type 2 diabetes mellitus without complications: Secondary | ICD-10-CM | POA: Diagnosis not present

## 2022-11-03 NOTE — Pre-Procedure Instructions (Signed)
Surgical Instructions    Your procedure is scheduled on Friday 11/06/22.   Report to [facility] Main Entrance "A" at 08:30 A.M., then check in with the Admitting office.  Call this number if you have problems the morning of surgery:  [phone]   If you have any questions prior to your surgery date call [phone]: Open Monday-Friday 8am-4pm If you experience any cold or flu symptoms such as cough, fever, chills, shortness of breath, etc. between now and your scheduled surgery, please notify us at the above number     Remember:  Do not eat after midnight the night before your surgery  You may drink clear liquids until 07:30 A.M. the morning of your surgery.   Clear liquids allowed are: Water, Non-Citrus Juices (without pulp), Carbonated Beverages, Clear Tea, Black Coffee ONLY (NO MILK, CREAM OR POWDERED CREAMER of any kind), and Gatorade    Take these medicines the morning of surgery with A SIP OF WATER:   amLODipine (NORVASC)     Take these medicines if needed:   albuterol (PROVENTIL HFA;VENTOLIN HFA)   Fluticasone-Salmeterol (ADVAIR)    As of today, STOP taking any Aspirin (unless otherwise instructed by your surgeon) Aleve, Naproxen, Ibuprofen, Motrin, Advil, Goody's, BC's, all herbal medications, fish oil, and all vitamins.           Do not wear jewelry or makeup. Do not wear lotions, powders, perfumes/cologne or deodorant. Do not shave 48 hours prior to surgery.  Men may shave face and neck. Do not bring valuables to the hospital. Do not wear nail polish, gel polish, artificial nails, or any other type of covering on natural nails (fingers and toes) If you have artificial nails or gel coating that need to be removed by a nail salon, please have this removed prior to surgery. Artificial nails or gel coating may interfere with anesthesia's ability to adequately monitor your vital signs.  [place] is not responsible for any belongings or valuables.    Do NOT Smoke  (Tobacco/Vaping)  24 hours prior to your procedure  If you use a CPAP at night, you may bring your mask for your overnight stay.   Contacts, glasses, hearing aids, dentures or partials may not be worn into surgery, please bring cases for these belongings   For patients admitted to the hospital, discharge time will be determined by your treatment team.   Patients discharged the day of surgery will not be allowed to drive home, and someone needs to stay with them for 24 hours.   SURGICAL WAITING ROOM VISITATION Patients having surgery or a procedure may have no more than 2 support people in the waiting area - these visitors may rotate.   Children under the age of 66 must have an adult with them who is not the patient. If the patient needs to stay at the hospital during part of their recovery, the visitor guidelines for inpatient rooms apply. Pre-op nurse will coordinate an appropriate time for 1 support person to accompany patient in pre-op.  This support person may not rotate.   Please refer to [email or web address] for the visitor guidelines for Inpatients (after your surgery is over and you are in a regular room).    Special instructions:    Oral Hygiene is also important to reduce your risk of infection.  Remember - BRUSH YOUR TEETH THE MORNING OF SURGERY WITH YOUR REGULAR TOOTHPASTE   [facility]- Preparing For Surgery  Before surgery, you can play an important role. Because skin  is not sterile, your skin needs to be as free of germs as possible. You can reduce the number of germs on your skin by washing with CHG (chlorahexidine gluconate) Soap before surgery.  CHG is an antiseptic cleaner which kills germs and bonds with the skin to continue killing germs even after washing.     Please do not use if you have an allergy to CHG or antibacterial soaps. If your skin becomes reddened/irritated stop using the CHG.  Do not shave (including  legs and underarms) for at least 48 hours prior to first CHG shower. It is OK to shave your face.  Please follow these instructions carefully.     Shower the NIGHT BEFORE SURGERY and the MORNING OF SURGERY with CHG Soap.   If you chose to wash your hair, wash your hair first as usual with your normal shampoo. After you shampoo, rinse your hair and body thoroughly to remove the shampoo.  Then [employer] and genitals (private parts) with your normal soap and rinse thoroughly to remove soap.  After that Use CHG Soap as you would any other liquid soap. You can apply CHG directly to the skin and wash gently with a scrungie or a clean washcloth.   Apply the CHG Soap to your body ONLY FROM THE NECK DOWN.  Do not use on open wounds or open sores. Avoid contact with your eyes, ears, mouth and genitals (private parts). Wash Face and genitals (private parts)  with your normal soap.   Wash thoroughly, paying special attention to the area where your surgery will be performed.  Thoroughly rinse your body with warm water from the neck down.  DO NOT shower/wash with your normal soap after using and rinsing off the CHG Soap.  Pat yourself dry with a CLEAN TOWEL.  Wear CLEAN PAJAMAS to bed the night before surgery  Place CLEAN SHEETS on your bed the night before your surgery  DO NOT SLEEP WITH PETS.   Day of Surgery:  Take a shower with CHG soap. Wear Clean/Comfortable clothing the morning of surgery Do not apply any deodorants/lotions.   Remember to brush your teeth WITH YOUR REGULAR TOOTHPASTE.    If you received a COVID test during your pre-op visit, it is requested that you wear a mask when out in public, stay away from anyone that may not be feeling well, and notify your surgeon if you develop symptoms. If you have been in contact with anyone that has tested positive in the last 10 days, please notify your surgeon.    Please read over the following fact sheets that you were given.

## 2022-11-04 ENCOUNTER — Other Ambulatory Visit: Payer: Self-pay

## 2022-11-04 ENCOUNTER — Encounter (HOSPITAL_COMMUNITY)
Admission: RE | Admit: 2022-11-04 | Discharge: 2022-11-04 | Disposition: A | Discharge status: Home / Self Care | Payer: Medicare Other | Source: Ambulatory Visit | Attending: General Surgery | Admitting: General Surgery

## 2022-11-04 ENCOUNTER — Encounter (HOSPITAL_COMMUNITY): Payer: Self-pay

## 2022-11-04 VITALS — BP 146/94 | HR 66 | Temp 98.1°F | Resp 17 | Ht 70.0 in | Wt 184.6 lb

## 2022-11-04 DIAGNOSIS — I77810 Thoracic aortic ectasia: Secondary | ICD-10-CM | POA: Diagnosis not present

## 2022-11-04 DIAGNOSIS — E119 Type 2 diabetes mellitus without complications: Secondary | ICD-10-CM | POA: Diagnosis not present

## 2022-11-04 DIAGNOSIS — I700 Atherosclerosis of aorta: Secondary | ICD-10-CM | POA: Insufficient documentation

## 2022-11-04 DIAGNOSIS — Z01818 Encounter for other preprocedural examination: Secondary | ICD-10-CM | POA: Insufficient documentation

## 2022-11-04 DIAGNOSIS — I2510 Atherosclerotic heart disease of native coronary artery without angina pectoris: Secondary | ICD-10-CM | POA: Diagnosis not present

## 2022-11-04 DIAGNOSIS — K4090 Unilateral inguinal hernia, without obstruction or gangrene, not specified as recurrent: Secondary | ICD-10-CM | POA: Diagnosis not present

## 2022-11-04 DIAGNOSIS — Z86718 Personal history of other venous thrombosis and embolism: Secondary | ICD-10-CM | POA: Diagnosis not present

## 2022-11-04 DIAGNOSIS — I10 Essential (primary) hypertension: Secondary | ICD-10-CM | POA: Diagnosis not present

## 2022-11-04 DIAGNOSIS — I452 Bifascicular block: Secondary | ICD-10-CM | POA: Diagnosis not present

## 2022-11-04 DIAGNOSIS — J449 Chronic obstructive pulmonary disease, unspecified: Secondary | ICD-10-CM | POA: Insufficient documentation

## 2022-11-04 LAB — CBC
HCT: 44 % (ref 39.0–52.0)
Hemoglobin: 14.1 g/dL (ref 13.0–17.0)
MCH: 29.3 pg (ref 26.0–34.0)
MCHC: 32 g/dL (ref 30.0–36.0)
MCV: 91.3 fL (ref 80.0–100.0)
Platelets: 252 10*3/uL (ref 150–400)
RBC: 4.82 MIL/uL (ref 4.22–5.81)
RDW: 12.5 % (ref 11.5–15.5)
WBC: 5.2 10*3/uL (ref 4.0–10.5)
nRBC: 0 % (ref 0.0–0.2)

## 2022-11-04 LAB — HEMOGLOBIN A1C
Hgb A1c MFr Bld: 5.6 % (ref 4.8–5.6)
Mean Plasma Glucose: 114.02 mg/dL

## 2022-11-04 LAB — BASIC METABOLIC PANEL
Anion gap: 7 (ref 5–15)
BUN: 14 mg/dL (ref 8–23)
CO2: 29 mmol/L (ref 22–32)
Calcium: 8.7 mg/dL — ABNORMAL LOW (ref 8.9–10.3)
Chloride: 102 mmol/L (ref 98–111)
Creatinine, Ser: 1.17 mg/dL (ref 0.61–1.24)
GFR, Estimated: >60 mL/min (ref >60)
Glucose, Bld: 111 mg/dL — ABNORMAL HIGH (ref 70–99)
Potassium: 4.3 mmol/L (ref 3.5–5.1)
Sodium: 138 mmol/L (ref 135–145)

## 2022-11-04 LAB — GLUCOSE, CAPILLARY: Glucose-Capillary: 112 mg/dL — ABNORMAL HIGH (ref 70–99)

## 2022-11-04 NOTE — Progress Notes (Signed)
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 RBBB is new when compared to 08/25/15 tracing. He denied chest pain, SOB, dizziness, syncope, edema. He reports that he is very active. During nicer weather he walks regularly at [facility], multiple laps up to 3-6 miles. He is able to do this without stopping and without CV symptoms. He reports some inclines along the trail. During winter months he rides his stationary bicycles for 30 minutes. This summer he hiked up to a waterfall in [place]. He denied seeing a cardiologist. He is followed by PCP [name], MD whom he is been seeing for about 11 years now. He has never smoker or drank alcohol. 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 He is a "borderline" diabetic and checks his glucose once daily. CBGs are reportedly well controlled. He had some right sided weakness and memory impairments with his SDH surgeries in 2013, but says these have improved. He is on Advair for COPD. He rarely requires albuterol MDI.   Discussed EKG findings. He activity tolerance is > 4 METS. He does not have any CV symptoms or symptoms of conduction disorder. 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 He denied any known family history of aortic aneurysmal disease. I encouraged him to discuss any follow-up recommendations with his PCP. Once his office is back open, I will also plan to forward to Dr. Elijio Miles for future follow-up purposes.  Labs acceptable for OR. He is active without CV symptoms. Anesthesia team to evaluate on the day of surgery. Discussed with anesthesiologist Suella Broad, MD.   VS: BP (!) 146/94   Pulse 66   Temp 36.7 C   Resp 17   Ht '5\' 10"'$  (1.778 m)   Wt 83.7 kg   SpO2 100%   BMI 26.49 kg/m Provider wore a face mask. Very pleasant black male in NAD. No conversational dyspnea. Ambulates independently. Heart RRR, no murmur noted. Lung sounds clear, but diminished. No carotid bruits noted. No significant edema noted. He wears partial dentures.    PROVIDERS: [name], MD is PCP ([facility])   LABS: Labs reviewed: Acceptable for surgery.  (all labs ordered are listed, but only abnormal results are displayed)  Labs Reviewed  BASIC METABOLIC PANEL - Abnormal; Notable for the following components:      Result Value   Glucose, Bld 111 (*)    Calcium 8.7 (*)    All other components within normal limits  GLUCOSE, CAPILLARY - Abnormal; Notable for the following components:   Glucose-Capillary 112 (*)    All other components within normal limits  CBC  HEMOGLOBIN A1C     IMAGES: CT Chest 12/03/17: IMPRESSION: 1. The appearance of the  lungs is most compatible with underlying bronchitis and probable developing multilobar bronchopneumonia. 2. Aortic atherosclerosis, in addition to left anterior descending coronary artery disease. Assessment for potential risk factor modification, dietary therapy or pharmacologic therapy may be warranted, if clinically indicated. 3. 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 Recommend annual imaging followup by CTA or MRA. This  recommendation follows 2010 ACCF/AHA/AATS/ACR/ASA/SCA/SCAI/SIR/STS/SVM Guidelines for the Diagnosis and Management of Patients with Thoracic Aortic Disease. Circulation. 2010; 121: I948-N462 - Aortic Atherosclerosis (ICD10-I70.0).   EKG: 11/04/22:  Normal sinus rhythm Right bundle branch block Left anterior fascicular block ** Bifascicular block ** Minimal voltage criteria for LVH, may be normal variant ( R in aVL ) Abnormal ECG When compared with ECG of 25-Aug-2015 05:39,   CV: N/A. [facility] does not have record of prior EKG, stress, or echocardiogram.    Past Medical History:  Diagnosis Date   COPD (chronic obstructive pulmonary disease) ([place])    related to occupational exposures   Diabetes mellitus without complication ([place])    DVT (deep venous thrombosis) ([place]) 07/20/2012   bilateral soleal vein   Hypertension    SDH (subdural hematoma) ([place]) 06/06/2012    Past Surgical History:  Procedure Laterality Date   BRAIN SURGERY  2013   right craniotomy for evacuation of SDH, complicated by subdural empyema, s/p craniectomy, cranioplasty   COLONOSCOPY WITH PROPOFOL N/A 03/11/2020   Procedure: COLONOSCOPY WITH PROPOFOL;  Surgeon: Toledo, Benay Pike, MD;  Location: [facility] ENDOSCOPY;  Service: Gastroenterology;  Laterality: N/A;   NASAL POLYP EXCISION      MEDICATIONS:  albuterol (PROVENTIL HFA;VENTOLIN HFA) 108 (90 BASE) MCG/ACT inhaler   amLODipine (NORVASC) 10 MG tablet   chlorthalidone (HYGROTON) 25 MG tablet   Fluticasone-Salmeterol (ADVAIR) 250-50 MCG/DOSE AEPB   No current facility-administered medications for this encounter.    Myra Gianotti, PA-C Surgical Short Stay/Anesthesiology [facility] Phone [phone] [facility] Phone [phone] 11/04/2022 5:12 PM

## 2022-11-04 NOTE — Progress Notes (Signed)
PCP - Dr.Sahmed Tejan-Sie Cardiologist - pt denies  PPM/ICD - pt denies Device Orders - n/a Rep Notified - n/a  Chest x-ray - n/a EKG - 11/04/22 Stress Test - pt denies ECHO - pt denies Cardiac Cath - pt denies  Sleep Study - pt denies CPAP - n/a  Fasting Blood Sugar - 135, pt patient Checks Blood Sugar once a day  Last dose of GLP1 agonist-  pt denies GLP1 instructions: n/a  Blood Thinner Instructions:pt denies Aspirin Instructions: pt denies  ERAS Protcol -yes  PRE-SURGERY Ensure or G2- none ordered   COVID TEST- n/a   Anesthesia review: YES EKG abnormal.   Patient denies shortness of breath, fever, cough and chest pain at PAT appointment   All instructions explained to the patient, with a verbal understanding of the material. Patient agrees to go over the instructions while at home for a better understanding. Patient also instructed to self quarantine after being tested for COVID-19. The opportunity to ask questions was provided.

## 2022-11-06 NOTE — Progress Notes (Signed)
Sent message, via epic in basket, requesting orders in epic from surgeon.  

## 2022-11-06 NOTE — Progress Notes (Signed)
Updated date of surgery: 11/10/22  Updated time of arrival: 1115 AM  Patient will be discharged from hospital and monitored at home for 24 hours by: Deloris (wife) [phone] or [phone]  Patient denies any changes in allergies, medications, medical history since pre op appointment on: 11/04/22  Pre op instructions reviewed, follow up questions addressed and patient verbalized understanding at this time.

## 2022-11-09 ENCOUNTER — Ambulatory Visit: Payer: Self-pay | Admitting: General Surgery

## 2022-11-10 ENCOUNTER — Other Ambulatory Visit: Payer: Self-pay

## 2022-11-10 ENCOUNTER — Encounter (HOSPITAL_COMMUNITY)
Admission: RE | Disposition: A | Discharge status: Home / Self Care | Payer: Self-pay | Source: Ambulatory Visit | Attending: General Surgery

## 2022-11-10 ENCOUNTER — Ambulatory Visit (HOSPITAL_BASED_OUTPATIENT_CLINIC_OR_DEPARTMENT_OTHER): Payer: Medicare Other | Admitting: Certified Registered Nurse Anesthetist

## 2022-11-10 ENCOUNTER — Ambulatory Visit (HOSPITAL_COMMUNITY): Payer: Medicare Other | Admitting: Vascular Surgery

## 2022-11-10 ENCOUNTER — Encounter (HOSPITAL_COMMUNITY): Payer: Self-pay | Admitting: General Surgery

## 2022-11-10 ENCOUNTER — Ambulatory Visit (HOSPITAL_COMMUNITY)
Admission: RE | Admit: 2022-11-10 | Discharge: 2022-11-10 | Disposition: A | Discharge status: Home / Self Care | Payer: Medicare Other | Source: Ambulatory Visit | Attending: General Surgery | Admitting: General Surgery

## 2022-11-10 DIAGNOSIS — E119 Type 2 diabetes mellitus without complications: Secondary | ICD-10-CM

## 2022-11-10 DIAGNOSIS — G8918 Other acute postprocedural pain: Secondary | ICD-10-CM | POA: Diagnosis not present

## 2022-11-10 DIAGNOSIS — K4090 Unilateral inguinal hernia, without obstruction or gangrene, not specified as recurrent: Secondary | ICD-10-CM | POA: Insufficient documentation

## 2022-11-10 DIAGNOSIS — I10 Essential (primary) hypertension: Secondary | ICD-10-CM

## 2022-11-10 DIAGNOSIS — J449 Chronic obstructive pulmonary disease, unspecified: Secondary | ICD-10-CM

## 2022-11-10 DIAGNOSIS — K449 Diaphragmatic hernia without obstruction or gangrene: Secondary | ICD-10-CM | POA: Diagnosis present

## 2022-11-10 DIAGNOSIS — Z01818 Encounter for other preprocedural examination: Secondary | ICD-10-CM

## 2022-11-10 HISTORY — PX: INGUINAL HERNIA REPAIR: SHX194

## 2022-11-10 LAB — GLUCOSE, CAPILLARY: Glucose-Capillary: 120 mg/dL — ABNORMAL HIGH (ref 70–99)

## 2022-11-10 SURGERY — REPAIR, HERNIA, INGUINAL, ADULT
Anesthesia: General | Laterality: Right

## 2022-11-10 MED ORDER — CEFAZOLIN SODIUM-DEXTROSE 2-4 GM/100ML-% IV SOLN
2.0000 g | INTRAVENOUS | Status: AC
  Administered 2022-11-10: 2 g via INTRAVENOUS
  Filled 2022-11-10: qty 100

## 2022-11-10 MED ORDER — MIDAZOLAM HCL 2 MG/2ML IJ SOLN
INTRAMUSCULAR | Status: AC
  Administered 2022-11-10: 1 mg via INTRAVENOUS
  Filled 2022-11-10: qty 2

## 2022-11-10 MED ORDER — BUPIVACAINE HCL 0.25 % IJ SOLN
INTRAMUSCULAR | Status: DC | PRN
  Administered 2022-11-10: 25 mL

## 2022-11-10 MED ORDER — ACETAMINOPHEN 500 MG PO TABS
1000.0000 mg | ORAL_TABLET | Freq: Once | ORAL | Status: AC
  Administered 2022-11-10: 1000 mg via ORAL
  Filled 2022-11-10: qty 2

## 2022-11-10 MED ORDER — CHLORHEXIDINE GLUCONATE CLOTH 2 % EX PADS: 6.0000 | MEDICATED_PAD | Freq: Once | CUTANEOUS | Status: DC

## 2022-11-10 MED ORDER — BUPIVACAINE HCL 0.25 % IJ SOLN
INTRAMUSCULAR | Status: DC | PRN
  Administered 2022-11-10: 15 mL

## 2022-11-10 MED ORDER — AMISULPRIDE (ANTIEMETIC) 5 MG/2ML IV SOLN: 10.0000 mg | Freq: Once | INTRAVENOUS | Status: DC | PRN

## 2022-11-10 MED ORDER — ORAL CARE MOUTH RINSE: 15.0000 mL | Freq: Once | OROMUCOSAL | Status: AC

## 2022-11-10 MED ORDER — LACTATED RINGERS IV SOLN: INTRAVENOUS | Status: DC

## 2022-11-10 MED ORDER — CHLORHEXIDINE GLUCONATE 0.12 % MT SOLN
15.0000 mL | Freq: Once | OROMUCOSAL | Status: AC
  Administered 2022-11-10: 15 mL via OROMUCOSAL

## 2022-11-10 MED ORDER — BUPIVACAINE HCL (PF) 0.25 % IJ SOLN
INTRAMUSCULAR | Status: AC
  Filled 2022-11-10: qty 30

## 2022-11-10 MED ORDER — FENTANYL CITRATE (PF) 100 MCG/2ML IJ SOLN
INTRAMUSCULAR | Status: AC
  Filled 2022-11-10: qty 2

## 2022-11-10 MED ORDER — BUPIVACAINE LIPOSOME 1.3 % IJ SUSP
INTRAMUSCULAR | Status: DC | PRN
  Administered 2022-11-10: 10 mL

## 2022-11-10 MED ORDER — ONDANSETRON HCL 4 MG/2ML IJ SOLN
INTRAMUSCULAR | Status: DC | PRN
  Administered 2022-11-10: 4 mg via INTRAVENOUS

## 2022-11-10 MED ORDER — ACETAMINOPHEN 500 MG PO TABS: 1000.0000 mg | ORAL_TABLET | ORAL | Status: DC

## 2022-11-10 MED ORDER — PROMETHAZINE HCL 25 MG/ML IJ SOLN: 6.2500 mg | INTRAMUSCULAR | Status: DC | PRN

## 2022-11-10 MED ORDER — DEXAMETHASONE SODIUM PHOSPHATE 10 MG/ML IJ SOLN
INTRAMUSCULAR | Status: AC
  Filled 2022-11-10: qty 1

## 2022-11-10 MED ORDER — EPHEDRINE SULFATE-NACL 50-0.9 MG/10ML-% IV SOSY
PREFILLED_SYRINGE | INTRAVENOUS | Status: DC | PRN
  Administered 2022-11-10 (×2): 5 mg via INTRAVENOUS

## 2022-11-10 MED ORDER — OXYCODONE HCL 5 MG PO TABS: 5.0000 mg | ORAL_TABLET | Freq: Four times a day (QID) | ORAL | 0 refills | Status: DC | PRN

## 2022-11-10 MED ORDER — ONDANSETRON HCL 4 MG/2ML IJ SOLN
INTRAMUSCULAR | Status: AC
  Filled 2022-11-10: qty 2

## 2022-11-10 MED ORDER — PROPOFOL 10 MG/ML IV BOLUS
INTRAVENOUS | Status: DC | PRN
  Administered 2022-11-10: 150 mg via INTRAVENOUS

## 2022-11-10 MED ORDER — FENTANYL CITRATE (PF) 100 MCG/2ML IJ SOLN
INTRAMUSCULAR | Status: DC | PRN
  Administered 2022-11-10 (×4): 25 ug via INTRAVENOUS

## 2022-11-10 MED ORDER — FENTANYL CITRATE PF 50 MCG/ML IJ SOSY: 50.0000 ug | PREFILLED_SYRINGE | INTRAMUSCULAR | Status: DC

## 2022-11-10 MED ORDER — FENTANYL CITRATE PF 50 MCG/ML IJ SOSY
PREFILLED_SYRINGE | INTRAMUSCULAR | Status: AC
  Administered 2022-11-10: 50 ug via INTRAVENOUS
  Filled 2022-11-10: qty 2

## 2022-11-10 MED ORDER — FENTANYL CITRATE PF 50 MCG/ML IJ SOSY: 25.0000 ug | PREFILLED_SYRINGE | INTRAMUSCULAR | Status: DC | PRN

## 2022-11-10 MED ORDER — PROPOFOL 10 MG/ML IV BOLUS
INTRAVENOUS | Status: AC
  Filled 2022-11-10: qty 20

## 2022-11-10 MED ORDER — 0.9 % SODIUM CHLORIDE (POUR BTL) OPTIME
TOPICAL | Status: DC | PRN
  Administered 2022-11-10: 1000 mL

## 2022-11-10 MED ORDER — DEXAMETHASONE SODIUM PHOSPHATE 10 MG/ML IJ SOLN
INTRAMUSCULAR | Status: DC | PRN
  Administered 2022-11-10: 4 mg via INTRAVENOUS

## 2022-11-10 MED ORDER — MIDAZOLAM HCL 2 MG/2ML IJ SOLN: 1.0000 mg | INTRAMUSCULAR | Status: DC

## 2022-11-10 SURGICAL SUPPLY — 31 items
ADH SKN CLS APL DERMABOND .7 (GAUZE/BANDAGES/DRESSINGS) ×1
APL SKNCLS STERI-STRIP NONHPOA (GAUZE/BANDAGES/DRESSINGS)
BAG COUNTER SPONGE SURGICOUNT (BAG) IMPLANT
BAG SPNG CNTER NS LX DISP (BAG)
BENZOIN TINCTURE PRP APPL 2/3 (GAUZE/BANDAGES/DRESSINGS) IMPLANT
COVER SURGICAL LIGHT HANDLE (MISCELLANEOUS) ×2 IMPLANT
DERMABOND ADVANCED .7 DNX12 (GAUZE/BANDAGES/DRESSINGS) IMPLANT
DRAIN PENROSE 0.5X18 (DRAIN) IMPLANT
DRAPE LAPAROTOMY T 98X78 PEDS (DRAPES) ×2 IMPLANT
DRSG TEGADERM 4X4.75 (GAUZE/BANDAGES/DRESSINGS) IMPLANT
ELECT REM PT RETURN 15FT ADLT (MISCELLANEOUS) ×2 IMPLANT
GLOVE BIO SURGEON STRL SZ7.5 (GLOVE) ×2 IMPLANT
GLOVE INDICATOR 8.0 STRL GRN (GLOVE) ×2 IMPLANT
GOWN STRL REUS W/ TWL XL LVL3 (GOWN DISPOSABLE) ×4 IMPLANT
GOWN STRL REUS W/TWL XL LVL3 (GOWN DISPOSABLE) ×2
KIT BASIN OR (CUSTOM PROCEDURE TRAY) ×2 IMPLANT
KIT TURNOVER KIT A (KITS) IMPLANT
MARKER SKIN DUAL TIP RULER LAB (MISCELLANEOUS) ×2 IMPLANT
MESH BARD SOFT 3X6IN (Mesh General) IMPLANT
NEEDLE HYPO 22GX1.5 SAFETY (NEEDLE) ×2 IMPLANT
PACK GENERAL/GYN (CUSTOM PROCEDURE TRAY) ×2 IMPLANT
SPIKE FLUID TRANSFER (MISCELLANEOUS) IMPLANT
STRIP CLOSURE SKIN 1/2X4 (GAUZE/BANDAGES/DRESSINGS) ×2 IMPLANT
SUT MNCRL AB 4-0 PS2 18 (SUTURE) ×2 IMPLANT
SUT PROLENE 2 0 CT2 30 (SUTURE) ×4 IMPLANT
SUT VIC AB 2-0 SH 27 (SUTURE) ×1
SUT VIC AB 2-0 SH 27X BRD (SUTURE) ×2 IMPLANT
SUT VIC AB 3-0 SH 18 (SUTURE) ×2 IMPLANT
SYR 20ML LL LF (SYRINGE) ×2 IMPLANT
TOWEL OR 17X26 10 PK STRL BLUE (TOWEL DISPOSABLE) ×2 IMPLANT
TOWEL OR NON WOVEN STRL DISP B (DISPOSABLE) ×2 IMPLANT

## 2022-11-10 NOTE — Discharge Instructions (Signed)
[facility]  POST OP INSTRUCTIONS Always review your discharge instruction sheet given to you by the facility where your surgery was performed. IF YOU HAVE DISABILITY OR FAMILY LEAVE FORMS, YOU MUST BRING THEM TO THE OFFICE FOR PROCESSING.   DO NOT GIVE THEM TO YOUR DOCTOR.  PAIN CONTROL  First take acetaminophen (Tylenol) AND/or ibuprofen (Advil) to control your pain after surgery.  Follow directions on package.  Taking acetaminophen (Tylenol) and/or ibuprofen (Advil) regularly after surgery will help to control your pain and lower the amount of prescription pain medication you may need.  You should not take more than 3,000 mg (3 grams) of acetaminophen (Tylenol) in 24 hours.  You should not take ibuprofen (Advil), aleve, motrin, naprosyn or other NSAIDS if you have a history of stomach ulcers or chronic kidney disease.  A prescription for pain medication may be given to you upon discharge.  Take your pain medication as prescribed, if you still have uncontrolled pain after taking acetaminophen (Tylenol) or ibuprofen (Advil). Use ice packs to help control pain. If you need a refill on your pain medication, please contact your pharmacy.  They will contact our office to request authorization. Prescriptions will not be filled after 5pm or on week-ends.  HOME MEDICATIONS Take your usually prescribed medications unless otherwise directed.  DIET You should follow a light diet the first few days after arrival home.  Be sure to include lots of fluids daily. Avoid fatty, fried foods.   CONSTIPATION It is common to experience some constipation after surgery and if you are taking pain medication.  Increasing fluid intake and taking a stool softener (such as Colace) will usually help or prevent this problem from occurring.  A mild laxative (Milk of Magnesia or Miralax) should be taken according to package instructions if there are no bowel movements after 48 hours.  WOUND/INCISION  CARE Most patients will experience some swelling and bruising in the area of the incisions.  Ice packs will help.  Swelling and bruising can take several days to resolve.  Unless discharge instructions indicate otherwise, follow guidelines below  STERI-STRIPS - you may remove your outer bandages 48 hours after surgery, and you may shower at that time.  You have steri-strips (small skin tapes) in place directly over the incision.  These strips should be left on the skin for 7-10 days.   DERMABOND/SKIN GLUE - you may shower in 24 hours.  The glue will flake off over the next 2-3 weeks. Any sutures or staples will be removed at the office during your follow-up visit.  ACTIVITIES You may resume regular (light) daily activities beginning the next day--such as daily self-care, walking, climbing stairs--gradually increasing activities as tolerated.  You may have sexual intercourse when it is comfortable.  Refrain from any heavy lifting or straining until approved by your doctor. You may drive when you are no longer taking prescription pain medication, you can comfortably wear a seatbelt, and you can safely maneuver your car and apply brakes.  FOLLOW-UP You should see your doctor in the office for a follow-up appointment approximately 2-3 weeks after your surgery.  You should have been given your post-op/follow-up appointment when your surgery was scheduled.  If you did not receive a post-op/follow-up appointment, make sure that you call for this appointment within a day or two after you arrive home to insure a convenient appointment time.  OTHER INSTRUCTIONS DO NOT LIFT/PUSH/PULL ANYTHING GREATER THAN 10 LBS  WHEN TO CALL YOUR DOCTOR: Fever  over 101.0 Inability to urinate Continued bleeding from incision. Increased pain, redness, or drainage from the incision. Increasing abdominal pain  The clinic staff is available to answer your questions during regular business hours.  Please don't hesitate to  call and ask to speak to one of the nurses for clinical concerns.  If you have a medical emergency, go to the nearest emergency room or call 911.  A surgeon from [facility] is always on call at the hospital. [address] [place], [place], [place]  [address] ? [address] [place], [place]   [phone] ? [phone] ? FAX [phone] Web site: [email or web address]

## 2022-11-10 NOTE — H&P (Signed)
CC: here for surgery  Requesting provider: n/a  HPI: Oscar Davis is an 82 y.o. male who is here for  open repair of right inguinal hernia with mesh. Denies medical changes since seen in clinic in Dec.   Old hpi: Oscar Davis is a 82 y.o. male who is seen today as an office consultation at the request of Dr. Elijio Davis for evaluation of new patient\ (Inguinal hernia ) .   His past medical history includes asthma, hypertension, hyperlipidemia, diabetes mellitus type 2  He is accompanied by his wife. He states that he has had a bulge in his groin for quite some time. It is starting to bother him especially when he is walking. He denies any burning, shooting or stinging pain. It does change in size throughout the day. It generally goes back down when he lays down or smaller first thing in the morning. Does cause a general ache in the area. No nausea, vomiting, diarrhea or constipation. Denies TIAs, chest pain, chest pressure, shortness of breath. No blood thinners. No prior inguinal hernia repair.   Past Medical History:  Diagnosis Date   COPD (chronic obstructive pulmonary disease) ([place])    related to occupational exposures   Diabetes mellitus without complication ([place])    DVT (deep venous thrombosis) ([place]) 07/20/2012   bilateral soleal vein   Hypertension    SDH (subdural hematoma) ([place]) 06/06/2012    Past Surgical History:  Procedure Laterality Date   BRAIN SURGERY  2013   right craniotomy for evacuation of SDH, complicated by subdural empyema, s/p craniectomy, cranioplasty   COLONOSCOPY WITH PROPOFOL N/A 03/11/2020   Procedure: COLONOSCOPY WITH PROPOFOL;  Surgeon: Toledo, Benay Pike, MD;  Location: [facility] ENDOSCOPY;  Service: Gastroenterology;  Laterality: N/A;   NASAL POLYP EXCISION      Family History  Problem Relation Age of Onset   Alcoholism Father     Social:  reports that he has never smoked. He has never used smokeless tobacco. He reports that he does not drink  alcohol and does not use drugs.  Allergies:  Allergies  Allergen Reactions   Prednisone Other (See Comments)    Muscle pain    Medications: I have reviewed the patient's current medications.   ROS - all of the below systems have been reviewed with the patient and positives are indicated with bold text General: chills, fever or night sweats Eyes: blurry vision or double vision ENT: epistaxis or sore throat Allergy/Immunology: itchy/watery eyes or nasal congestion Hematologic/Lymphatic: bleeding problems, blood clots or swollen lymph nodes Endocrine: temperature intolerance or unexpected weight changes Breast: new or changing breast lumps or nipple discharge Resp: cough, shortness of breath, or wheezing CV: chest pain or dyspnea on exertion GI: as per HPI GU: dysuria, trouble voiding, or hematuria MSK: joint pain or joint stiffness Neuro: TIA or stroke symptoms Derm: pruritus and skin lesion changes Psych: anxiety and depression  PE Blood pressure (!) 153/79, pulse 61, temperature 98.4 F (36.9 C), temperature source Oral, height '5\' 10"'$  (1.778 m), weight 83.7 kg, SpO2 98 %. Constitutional: NAD; conversant; no deformities Eyes: Moist conjunctiva; no lid lag; anicteric; PERRL Neck: Trachea midline; no thyromegaly Lungs: Normal respiratory effort; no tactile fremitus CV: RRR; no palpable thrills; no pitting edema GI: Abd soft, nt, nd; Right groin bulge; no palpable hepatosplenomegaly MSK: Normal gait; no clubbing/cyanosis Psychiatric: Appropriate affect; alert and oriented x3 Lymphatic: No palpable cervical or axillary lymphadenopathy Skin:no rash/lesions/jaundice  No results found for this or any previous visit (from the  past 48 hour(s)).  No results found.  Imaging: N/a  A/P: Oscar Davis is an 82 y.o. male with  Right inguinal hernia Hypertension, essential  Diabetes mellitus type 2, diet-controlled (CMS-HCC)   To OR for right inguinal hernia repair with  mesh  All questions asked and answered  Leighton Ruff. Redmond Pulling, MD, FACS General, Bariatric, & Minimally Invasive Surgery [facility]

## 2022-11-10 NOTE — Anesthesia Postprocedure Evaluation (Signed)
Anesthesia Post Note  Patient: Oscar Davis  Procedure(s) Performed: OPEN REPAIR RIGHT INGUINAL HERNIA WITH MESH (Right)     Patient location during evaluation: PACU Anesthesia Type: General Level of consciousness: sedated Pain management: pain level controlled Vital Signs Assessment: post-procedure vital signs reviewed and stable Respiratory status: spontaneous breathing and respiratory function stable Cardiovascular status: stable Postop Assessment: no apparent nausea or vomiting Anesthetic complications: no   No notable events documented.  Last Vitals:  Vitals:   11/10/22 1530 11/10/22 1545  BP: (!) 161/87 (!) 161/92  Pulse: 62 66  Resp: 15 14  Temp:    SpO2: 91% 97%    Last Pain:  Vitals:   11/10/22 1545  TempSrc:   PainSc: 0-No pain                 Kiannah Grunow DANIEL

## 2022-11-10 NOTE — Anesthesia Procedure Notes (Signed)
Anesthesia Regional Block: TAP block   Pre-Anesthetic Checklist: , timeout performed,  Correct Patient, Correct Site, Correct Laterality,  Correct Procedure, Correct Position, site marked,  Risks and benefits discussed,  Surgical consent,  Pre-op evaluation,  At surgeon's request and post-op pain management  Laterality: Right  Prep: chloraprep       Needles:  Injection technique: Single-shot  Needle Type: Echogenic Stimulator Needle     Needle Length: 10cm  Needle Gauge: 21     Additional Needles:   Procedures:,,,, ultrasound used (permanent image in chart),,    Narrative:  Start time: 11/10/2022 12:34 PM End time: 11/10/2022 12:44 PM Injection made incrementally with aspirations every 5 mL.  Performed by: Personally

## 2022-11-10 NOTE — Op Note (Signed)
Open Right Indirct Inguinal Hernia Repair with Mesh Procedure Note  Indications: The patient presented with a history of a right, reducible hernia.    Pre-operative Diagnosis: right reducible inguinal hernia  Post-operative Diagnosis: right indirect inguinal hernia  Surgeon: Greer Pickerel MD FACS  Assistants: none  Anesthesia: General LMA anesthesia + TAP block    Surgeon: Leighton Ruff. Redmond Pulling, MD, FACS  Procedure Details  The patient was seen again in the Holding Room. The risks, benefits, complications, treatment options, and expected outcomes were discussed with the patient. The possibilities of reaction to medication, pulmonary aspiration, perforation of viscus, bleeding, recurrent infection, the need for additional procedures, and development of a complication requiring transfusion or further operation were discussed with the patient and/or family. The likelihood of success in repairing the hernia and returning the patient to their previous functional status is good.  There was concurrence with the proposed plan, and informed consent was obtained. The site of surgery was properly noted/marked. The patient was taken to the Operating Room, identified as [facility], and the procedure verified as right inguinal hernia repair. A Time Out was held and the above information confirmed.  The patient was placed in the supine position and underwent induction of anesthesia. The lower abdomen and groin was prepped with Chloraprep and draped in the standard fashion, and 0.25% Marcaine with epinephrine was used to anesthetize the skin over the mid-portion of the inguinal canal. An oblique incision was made. Dissection was carried down through the subcutaneous tissue with cautery to the external oblique fascia.  We opened the external oblique fascia along the direction of its fibers to the external ring.  The spermatic cord was circumferentially dissected bluntly and retracted with a Penrose drain.  The floor  of the inguinal canal was inspected & there was no defect in the floor.  The cord structures were densely fused to his hernia sac.  I was able to get most of the testicular vessels off of the hernia sac but his vas deferens was fused to the hernia sac.  I ended up opening up the hernia sac.  I ended up leaving a small portion attached to the vas deferens and testicular vessels.  There was some bleeding along the testicular vessels which was taken care of with a combination of electrocautery as well as with 3 of Vicryl suture.  I was able to palpate inside the hernia sac and feel and see small bowel.  Thus confirming that I was not within the bladder.  I closed the hernia sac with a running 3-0 Vicryl and returned to the abdominal cavity..  I ended up ligating the nerve with a 3-0 Vicryl suture on each end.   We used a 3 x 6 inch piece of Bard soft mesh, which was cut into a keyhole shape.  This was secured with 2-0 Prolene, beginning at the pubic tubercle, running this along the shelving edge inferiorly. Superiorly, the mesh was secured to the internal oblique fascia with interrupted 2-0 Prolene sutures.  The tails of the mesh were sutured together behind the spermatic cord.  The mesh was tucked underneath the external oblique fascia laterally.  The external oblique fascia was reapproximated with 2-0 Vicryl.  3-0 Vicryl was used to close the subcutaneous tissues and 4-0 Monocryl was used to close the skin in subcuticular fashion.  Dermabond was used to seal the incision.  A clean dressing was applied.  The patient was then extubated and brought to the recovery room in stable  condition.  All sponge, instrument, and needle counts were correct prior to closure and at the conclusion of the case.   Estimated Blood Loss: Minimal                 Complications: None; patient tolerated the procedure well.         Disposition: PACU - hemodynamically stable.         Condition: stable  Leighton Ruff. Redmond Pulling, MD,  FACS General, Bariatric, & Minimally Invasive Surgery [facility], [place]

## 2022-11-10 NOTE — Progress Notes (Signed)
RN called MD since pt's BP 174/91. No new orders. MD said pt ok to go to phase II.

## 2022-11-10 NOTE — Transfer of Care (Signed)
Immediate Anesthesia Transfer of Care Note  Patient: Oscar Davis  Procedure(s) Performed: OPEN REPAIR RIGHT INGUINAL HERNIA WITH MESH (Right)  Patient Location: PACU  Anesthesia Type:General  Level of Consciousness: drowsy and patient cooperative  Airway & Oxygen Therapy: Patient Spontanous Breathing and Patient connected to face mask oxygen  Post-op Assessment: Report given to RN and Post -op Vital signs reviewed and stable  Post vital signs: Reviewed and stable  Last Vitals:  Vitals Value Taken Time  BP 177/85 11/10/22 1450  Temp    Pulse 61 11/10/22 1454  Resp 16 11/10/22 1454  SpO2 100 % 11/10/22 1454  Vitals shown include unvalidated device data.  Last Pain:  Vitals:   11/10/22 1216  TempSrc:   PainSc: 0-No pain         Complications: No notable events documented.

## 2022-11-10 NOTE — Anesthesia Procedure Notes (Signed)
Procedure Name: LMA Insertion Date/Time: 11/10/2022 1:02 PM  Performed by: Eben Burow, CRNAPre-anesthesia Checklist: Patient identified, Emergency Drugs available, Suction available, Patient being monitored and Timeout performed Patient Re-evaluated:Patient Re-evaluated prior to induction Oxygen Delivery Method: Circle system utilized Preoxygenation: Pre-oxygenation with 100% oxygen Induction Type: IV induction Ventilation: Mask ventilation without difficulty LMA: LMA inserted and LMA with gastric port inserted LMA Size: 4.0 Number of attempts: 1 Tube secured with: Tape Dental Injury: Teeth and Oropharynx as per pre-operative assessment

## 2022-11-10 NOTE — Anesthesia Preprocedure Evaluation (Addendum)
Anesthesia Evaluation  Patient identified by MRN, date of birth, ID band Patient awake    Reviewed: Allergy & Precautions, NPO status , Patient's Chart, lab work & pertinent test results  History of Anesthesia Complications Negative for: history of anesthetic complications  Airway Mallampati: I  TM Distance: >3 FB Neck ROM: Full    Dental  (+) Lower Dentures, Upper Dentures, Dental Advisory Given   Pulmonary COPD,  COPD inhaler   Pulmonary exam normal        Cardiovascular hypertension, Normal cardiovascular exam     Neuro/Psych Hx SDH negative neurological ROS     GI/Hepatic negative GI ROS, Neg liver ROS,,,  Endo/Other  diabetes    Renal/GU negative Renal ROS     Musculoskeletal negative musculoskeletal ROS (+)    Abdominal   Peds  Hematology negative hematology ROS (+)   Anesthesia Other Findings   Reproductive/Obstetrics                             Anesthesia Physical Anesthesia Plan  ASA: 3  Anesthesia Plan: General   Post-op Pain Management: Regional block* and Tylenol PO (pre-op)*   Induction: Intravenous  PONV Risk Score and Plan: 4 or greater and Ondansetron, Dexamethasone, Diphenhydramine and Treatment may vary due to age or medical condition  Airway Management Planned: LMA  Additional Equipment:   Intra-op Plan:   Post-operative Plan: Extubation in OR  Informed Consent: I have reviewed the patients History and Physical, chart, labs and discussed the procedure including the risks, benefits and alternatives for the proposed anesthesia with the patient or authorized representative who has indicated his/her understanding and acceptance.     Dental advisory given  Plan Discussed with: Anesthesiologist and CRNA  Anesthesia Plan Comments:        Anesthesia Quick Evaluation

## 2022-11-11 ENCOUNTER — Encounter (HOSPITAL_COMMUNITY): Payer: Self-pay | Admitting: General Surgery

## 2022-12-15 ENCOUNTER — Ambulatory Visit (INDEPENDENT_AMBULATORY_CARE_PROVIDER_SITE_OTHER): Payer: Medicare Other | Admitting: Cardiovascular Disease

## 2022-12-15 ENCOUNTER — Encounter: Payer: Self-pay | Admitting: Cardiovascular Disease

## 2022-12-15 VITALS — BP 120/70 | HR 74 | Ht 71.0 in | Wt 182.8 lb

## 2022-12-15 DIAGNOSIS — R42 Dizziness and giddiness: Secondary | ICD-10-CM | POA: Insufficient documentation

## 2022-12-15 DIAGNOSIS — Z09 Encounter for follow-up examination after completed treatment for conditions other than malignant neoplasm: Secondary | ICD-10-CM | POA: Insufficient documentation

## 2022-12-15 DIAGNOSIS — E782 Mixed hyperlipidemia: Secondary | ICD-10-CM

## 2022-12-15 DIAGNOSIS — R9431 Abnormal electrocardiogram [ECG] [EKG]: Secondary | ICD-10-CM | POA: Insufficient documentation

## 2022-12-15 DIAGNOSIS — I10 Essential (primary) hypertension: Secondary | ICD-10-CM | POA: Diagnosis not present

## 2022-12-15 NOTE — Assessment & Plan Note (Signed)
Continue rosuvastatin 5 mg.

## 2022-12-15 NOTE — Progress Notes (Signed)
Cardiology Office Note   Date:  12/15/2022   ID:  Oscar Davis, DOB 01/07/1941, MRN [id number]  PCP:  [name], MD  Cardiologist:  Neoma Laming, MD      History of Present Illness: Oscar Davis is a 82 y.o. male who presents for  Chief Complaint  Patient presents with   Follow-up    Follow up    Patient in office to establish care. Had an episode of dizziness, "wooziness" last week. Denies chest pain, shortness of breath.   Dizziness This is a new problem. The current episode started in the past 7 days. The problem has been resolved. He has tried position changes for the symptoms. The treatment provided significant relief.      Past Medical History:  Diagnosis Date   COPD (chronic obstructive pulmonary disease) ([place])    related to occupational exposures   Diabetes mellitus without complication ([place])    DVT (deep venous thrombosis) ([place]) 07/20/2012   bilateral soleal vein   Hypertension    SDH (subdural hematoma) ([place]) 06/06/2012     Past Surgical History:  Procedure Laterality Date   BRAIN SURGERY  2013   right craniotomy for evacuation of SDH, complicated by subdural empyema, s/p craniectomy, cranioplasty   COLONOSCOPY WITH PROPOFOL N/A 03/11/2020   Procedure: COLONOSCOPY WITH PROPOFOL;  Surgeon: Toledo, Benay Pike, MD;  Location: [facility] ENDOSCOPY;  Service: Gastroenterology;  Laterality: N/A;   INGUINAL HERNIA REPAIR Right 11/10/2022   Procedure: OPEN REPAIR RIGHT INGUINAL HERNIA WITH MESH;  Surgeon: Greer Pickerel, MD;  Location: [facility];  Service: General;  Laterality: Right;  GEN AND TAP BLOCK   NASAL POLYP EXCISION       Current Outpatient Medications  Medication Sig Dispense Refill   albuterol (PROVENTIL HFA;VENTOLIN HFA) 108 (90 BASE) MCG/ACT inhaler Inhale 2 puffs into the lungs every 6 (six) hours as needed for wheezing or shortness of breath.     amLODipine (NORVASC) 10 MG tablet Take 10 mg by mouth daily.     chlorthalidone (HYGROTON) 25  MG tablet Take 25 mg by mouth daily.     Fluticasone-Salmeterol (ADVAIR) 250-50 MCG/DOSE AEPB Inhale 1 puff into the lungs 2 (two) times daily as needed (shortness of breath).     oxyCODONE (OXY IR/ROXICODONE) 5 MG immediate release tablet Take 1 tablet (5 mg total) by mouth every 6 (six) hours as needed for severe pain. 15 tablet 0   No current facility-administered medications for this visit.    Allergies:   Prednisone    Social History:   reports that he has never smoked. He has never used smokeless tobacco. He reports that he does not drink alcohol and does not use drugs.   Family History:  family history includes Alcoholism in his father.    ROS:     Review of Systems  Constitutional: Negative.   HENT: Negative.    Eyes: Negative.   Respiratory: Negative.    Gastrointestinal: Negative.   Genitourinary: Negative.   Musculoskeletal: Negative.   Skin: Negative.   Neurological:  Positive for dizziness.  Endo/Heme/Allergies: Negative.   Psychiatric/Behavioral: Negative.    All other systems reviewed and are negative.    All other systems are reviewed and negative.    PHYSICAL EXAM: VS:  BP 120/70   Pulse 74   Ht '5\' 11"'$  (1.803 m)   Wt 182 lb 12.8 oz (82.9 kg)   SpO2 97%   BMI 25.50 kg/m  , BMI Body mass index is 25.5  kg/m. Last weight:  Wt Readings from Last 3 Encounters:  12/15/22 182 lb 12.8 oz (82.9 kg)  11/10/22 176 lb 5.9 oz (80 kg)  11/04/22 184 lb 9.6 oz (83.7 kg)     Physical Exam Vitals reviewed.  Constitutional:      Appearance: Normal appearance. He is normal weight.  HENT:     Head: Normocephalic.     Nose: Nose normal.     Mouth/Throat:     Mouth: Mucous membranes are moist.  Eyes:     Pupils: Pupils are equal, round, and reactive to light.  Cardiovascular:     Rate and Rhythm: Normal rate and regular rhythm.     Pulses: Normal pulses.     Heart sounds: Normal heart sounds.  Pulmonary:     Effort: Pulmonary effort is normal.  Abdominal:      General: Abdomen is flat. Bowel sounds are normal.  Musculoskeletal:        General: Normal range of motion.     Cervical back: Normal range of motion.  Skin:    General: Skin is warm.  Neurological:     General: No focal deficit present.     Mental Status: He is alert.  Psychiatric:        Mood and Affect: Mood normal.      EKG: normal sinus rhythm, RBBB, LAE, bifascicular block, HR 65 bpm  Recent Labs: 11/04/2022: BUN 14; Creatinine, Ser 1.17; Hemoglobin 14.1; Platelets 252; Potassium 4.3; Sodium 138    Lipid Panel No results found for: "CHOL", "TRIG", "HDL", "CHOLHDL", "VLDL", "LDLCALC", "LDLDIRECT"    ASSESSMENT AND PLAN:    ICD-10-CM   1. Abnormal EKG  R94.31 PCV ECHOCARDIOGRAM COMPLETE    MYOCARDIAL PERFUSION IMAGING    CARDIAC EVENT MONITOR    2. Dizziness  R42 PCV ECHOCARDIOGRAM COMPLETE    MYOCARDIAL PERFUSION IMAGING    CARDIAC EVENT MONITOR    3. Mixed hyperlipidemia  E78.2 MYOCARDIAL PERFUSION IMAGING    4. Primary hypertension  I10 PCV ECHOCARDIOGRAM COMPLETE    MYOCARDIAL PERFUSION IMAGING       Problem List Items Addressed This Visit       Cardiovascular and Mediastinum   Primary hypertension    Well controlled. Continue chlorthalidone.      Relevant Orders   PCV ECHOCARDIOGRAM COMPLETE   MYOCARDIAL PERFUSION IMAGING     Other   Abnormal EKG - Primary    Patient had episode of dizziness/wooziness. EKG in office today revealed RBBB, bifascicular block. Stress test ordered to check for CAD. Echo ordered to check heart block. 14 day event monitor to check for arrhythmias.       Relevant Orders   PCV ECHOCARDIOGRAM COMPLETE   MYOCARDIAL PERFUSION IMAGING   CARDIAC EVENT MONITOR   Dizziness   Relevant Orders   PCV ECHOCARDIOGRAM COMPLETE   MYOCARDIAL PERFUSION IMAGING   CARDIAC EVENT MONITOR   Mixed hyperlipidemia    Continue rosuvastatin 5 mg.       Relevant Orders   MYOCARDIAL PERFUSION IMAGING     Disposition:   No  follow-ups on file.    Total time spent: 30 minutes  Signed,  Neoma Laming, MD  12/15/2022 12:57 [place]

## 2022-12-15 NOTE — Assessment & Plan Note (Signed)
Patient had episode of dizziness/wooziness. EKG in office today revealed RBBB, bifascicular block. Stress test ordered to check for CAD. Echo ordered to check heart block. 14 day event monitor to check for arrhythmias.

## 2022-12-15 NOTE — Assessment & Plan Note (Signed)
Well controlled. Continue chlorthalidone.

## 2022-12-29 ENCOUNTER — Ambulatory Visit (INDEPENDENT_AMBULATORY_CARE_PROVIDER_SITE_OTHER): Payer: Medicare HMO

## 2022-12-29 DIAGNOSIS — R42 Dizziness and giddiness: Secondary | ICD-10-CM

## 2022-12-29 DIAGNOSIS — I361 Nonrheumatic tricuspid (valve) insufficiency: Secondary | ICD-10-CM

## 2022-12-29 DIAGNOSIS — R9431 Abnormal electrocardiogram [ECG] [EKG]: Secondary | ICD-10-CM

## 2022-12-29 DIAGNOSIS — I10 Essential (primary) hypertension: Secondary | ICD-10-CM

## 2022-12-29 DIAGNOSIS — I340 Nonrheumatic mitral (valve) insufficiency: Secondary | ICD-10-CM | POA: Diagnosis not present

## 2022-12-31 ENCOUNTER — Ambulatory Visit (INDEPENDENT_AMBULATORY_CARE_PROVIDER_SITE_OTHER): Payer: Medicare HMO

## 2022-12-31 DIAGNOSIS — R55 Syncope and collapse: Secondary | ICD-10-CM | POA: Diagnosis not present

## 2022-12-31 DIAGNOSIS — I10 Essential (primary) hypertension: Secondary | ICD-10-CM

## 2022-12-31 DIAGNOSIS — R42 Dizziness and giddiness: Secondary | ICD-10-CM

## 2022-12-31 DIAGNOSIS — E782 Mixed hyperlipidemia: Secondary | ICD-10-CM

## 2022-12-31 DIAGNOSIS — R9431 Abnormal electrocardiogram [ECG] [EKG]: Secondary | ICD-10-CM

## 2022-12-31 MED ORDER — TECHNETIUM TC 99M SESTAMIBI GENERIC - CARDIOLITE
32.8000 | Freq: Once | INTRAVENOUS | Status: AC | PRN
  Administered 2022-12-31: 32.8 via INTRAVENOUS

## 2022-12-31 MED ORDER — TECHNETIUM TC 99M SESTAMIBI GENERIC - CARDIOLITE
9.6000 | Freq: Once | INTRAVENOUS | Status: AC | PRN
  Administered 2022-12-31: 9.6 via INTRAVENOUS

## 2023-01-05 ENCOUNTER — Encounter: Payer: Self-pay | Admitting: Cardiovascular Disease

## 2023-01-05 ENCOUNTER — Ambulatory Visit (INDEPENDENT_AMBULATORY_CARE_PROVIDER_SITE_OTHER): Payer: Medicare HMO | Admitting: Cardiovascular Disease

## 2023-01-05 VITALS — BP 128/68 | HR 69 | Temp 97.5°F | Ht 70.0 in | Wt 183.0 lb

## 2023-01-05 DIAGNOSIS — R42 Dizziness and giddiness: Secondary | ICD-10-CM | POA: Diagnosis not present

## 2023-01-05 DIAGNOSIS — R9431 Abnormal electrocardiogram [ECG] [EKG]: Secondary | ICD-10-CM

## 2023-01-05 DIAGNOSIS — E782 Mixed hyperlipidemia: Secondary | ICD-10-CM | POA: Diagnosis not present

## 2023-01-05 DIAGNOSIS — I10 Essential (primary) hypertension: Secondary | ICD-10-CM | POA: Diagnosis not present

## 2023-01-05 NOTE — Progress Notes (Signed)
Cardiology Office Note   Date:  01/05/2023   ID:  Donia Ast, DOB Jun 07, 1941, MRN [id number]  PCP:  [name], MD  Cardiologist:  Neoma Laming, MD      History of Present Illness: Oscar Davis is a 82 y.o. male who presents for  Chief Complaint  Patient presents with   Follow-up    NST/Echo results    Patient in office to discuss results of echo, stress test.     Past Medical History:  Diagnosis Date   COPD (chronic obstructive pulmonary disease) ([place])    related to occupational exposures   Diabetes mellitus without complication ([place])    DVT (deep venous thrombosis) ([place]) 07/20/2012   bilateral soleal vein   Hypertension    SDH (subdural hematoma) ([place]) 06/06/2012     Past Surgical History:  Procedure Laterality Date   BRAIN SURGERY  2013   right craniotomy for evacuation of SDH, complicated by subdural empyema, s/p craniectomy, cranioplasty   COLONOSCOPY WITH PROPOFOL N/A 03/11/2020   Procedure: COLONOSCOPY WITH PROPOFOL;  Surgeon: Toledo, Benay Pike, MD;  Location: [facility] ENDOSCOPY;  Service: Gastroenterology;  Laterality: N/A;   INGUINAL HERNIA REPAIR Right 11/10/2022   Procedure: OPEN REPAIR RIGHT INGUINAL HERNIA WITH MESH;  Surgeon: Greer Pickerel, MD;  Location: [facility];  Service: General;  Laterality: Right;  GEN AND TAP BLOCK   NASAL POLYP EXCISION       Current Outpatient Medications  Medication Sig Dispense Refill   albuterol (PROVENTIL HFA;VENTOLIN HFA) 108 (90 BASE) MCG/ACT inhaler Inhale 2 puffs into the lungs every 6 (six) hours as needed for wheezing or shortness of breath.     amLODipine (NORVASC) 10 MG tablet Take 10 mg by mouth daily.     chlorthalidone (HYGROTON) 25 MG tablet Take 25 mg by mouth daily.     Fluticasone-Salmeterol (ADVAIR) 250-50 MCG/DOSE AEPB Inhale 1 puff into the lungs 2 (two) times daily as needed (shortness of breath).     No current facility-administered medications for this visit.    Allergies:   Prednisone     Social History:   reports that he has never smoked. He has never used smokeless tobacco. He reports that he does not drink alcohol and does not use drugs.   Family History:  family history includes Alcoholism in his father.    ROS:     Review of Systems  Constitutional: Negative.   HENT: Negative.    Eyes: Negative.   Respiratory: Negative.    Cardiovascular: Negative.   Gastrointestinal: Negative.   Genitourinary: Negative.   Musculoskeletal: Negative.   Skin: Negative.   Neurological: Negative.   Endo/Heme/Allergies: Negative.   Psychiatric/Behavioral: Negative.    All other systems reviewed and are negative.   All other systems are reviewed and negative.   PHYSICAL EXAM: VS:  BP 128/68   Pulse 69   Temp (!) 97.5 F (36.4 C) (Tympanic)   Ht 5\' 10"  (1.778 m)   Wt 183 lb (83 kg)   SpO2 95%   BMI 26.26 kg/m  , BMI Body mass index is 26.26 kg/m. Last weight:  Wt Readings from Last 3 Encounters:  01/05/23 183 lb (83 kg)  12/15/22 182 lb 12.8 oz (82.9 kg)  11/10/22 176 lb 5.9 oz (80 kg)     Physical Exam Vitals reviewed.  Constitutional:      Appearance: Normal appearance. He is normal weight.  HENT:     Head: Normocephalic.     Nose: Nose normal.  Mouth/Throat:     Mouth: Mucous membranes are moist.  Eyes:     Pupils: Pupils are equal, round, and reactive to light.  Cardiovascular:     Rate and Rhythm: Normal rate and regular rhythm.     Pulses: Normal pulses.     Heart sounds: Normal heart sounds.  Pulmonary:     Effort: Pulmonary effort is normal.  Abdominal:     General: Abdomen is flat. Bowel sounds are normal.  Musculoskeletal:        General: Normal range of motion.     Cervical back: Normal range of motion.  Skin:    General: Skin is warm.  Neurological:     General: No focal deficit present.     Mental Status: He is alert.  Psychiatric:        Mood and Affect: Mood normal.     EKG: none today  Recent Labs: 11/04/2022: BUN 14;  Creatinine, Ser 1.17; Hemoglobin 14.1; Platelets 252; Potassium 4.3; Sodium 138    Lipid Panel No results found for: "CHOL", "TRIG", "HDL", "CHOLHDL", "VLDL", "LDLCALC", "LDLDIRECT"    Other studies Reviewed: echo, stress test   ASSESSMENT AND PLAN:    ICD-10-CM   1. Dizziness  R42 CT CORONARY MORPH W/CTA COR W/SCORE W/CA W/CM &/OR WO/CM    2. Primary hypertension  I10 CT CORONARY MORPH W/CTA COR W/SCORE W/CA W/CM &/OR WO/CM    Basic metabolic panel    3. Abnormal EKG  R94.31 CT CORONARY MORPH W/CTA COR W/SCORE W/CA W/CM &/OR WO/CM    4. Mixed hyperlipidemia  E78.2        Problem List Items Addressed This Visit       Cardiovascular and Mediastinum   Primary hypertension   Relevant Orders   CT CORONARY MORPH W/CTA COR W/SCORE W/CA W/CM &/OR WO/CM   Basic metabolic panel     Other   Abnormal EKG   Relevant Orders   CT CORONARY MORPH W/CTA COR W/SCORE W/CA W/CM &/OR WO/CM   Dizziness - Primary    Patient reports no further episodes of dizziness. Denies chest pain, shortness of breath. Echo revealed normal EF. Stress test equivocal stress test with normal LVEF, advise CCTA. CCTA ordered.       Relevant Orders   CT CORONARY MORPH W/CTA COR W/SCORE W/CA W/CM &/OR WO/CM   Mixed hyperlipidemia     Disposition:   Return in about 4 weeks (around 02/02/2023) for after CCTA.    Total time spent: 30 minutes  Signed,  Neoma Laming, MD  01/05/2023 9:26 AM    [place]

## 2023-01-05 NOTE — Assessment & Plan Note (Signed)
Patient reports no further episodes of dizziness. Denies chest pain, shortness of breath. Echo revealed normal EF. Stress test equivocal stress test with normal LVEF, advise CCTA. CCTA ordered.

## 2023-01-06 ENCOUNTER — Other Ambulatory Visit: Payer: Self-pay | Admitting: Internal Medicine

## 2023-01-06 LAB — BASIC METABOLIC PANEL
BUN/Creatinine Ratio: 13 (ref 10–24)
BUN: 15 mg/dL (ref 8–27)
CO2: 26 mmol/L (ref 20–29)
Calcium: 9.5 mg/dL (ref 8.6–10.2)
Chloride: 96 mmol/L (ref 96–106)
Creatinine, Ser: 1.18 mg/dL (ref 0.76–1.27)
Glucose: 67 mg/dL — ABNORMAL LOW (ref 70–99)
Potassium: 4.5 mmol/L (ref 3.5–5.2)
Sodium: 138 mmol/L (ref 134–144)
eGFR: 62 mL/min/{1.73_m2} (ref >59)

## 2023-01-14 ENCOUNTER — Other Ambulatory Visit: Payer: Self-pay

## 2023-01-18 ENCOUNTER — Encounter: Payer: Self-pay | Admitting: Cardiovascular Disease

## 2023-01-18 ENCOUNTER — Other Ambulatory Visit: Payer: Self-pay

## 2023-01-18 MED ORDER — FLUTICASONE-SALMETEROL 250-50 MCG/ACT IN AEPB: 1.0000 | INHALATION_SPRAY | Freq: Two times a day (BID) | RESPIRATORY_TRACT | 1 refills | Status: DC

## 2023-01-18 MED ORDER — CHLORTHALIDONE 25 MG PO TABS: 25.0000 mg | ORAL_TABLET | Freq: Every morning | ORAL | 1 refills | Status: DC

## 2023-01-18 MED ORDER — ALBUTEROL SULFATE HFA 108 (90 BASE) MCG/ACT IN AERS: 2.0000 | INHALATION_SPRAY | Freq: Four times a day (QID) | RESPIRATORY_TRACT | 3 refills | Status: DC | PRN

## 2023-01-18 MED ORDER — AMLODIPINE BESYLATE 10 MG PO TABS: 10.0000 mg | ORAL_TABLET | Freq: Every morning | ORAL | 1 refills | Status: DC

## 2023-01-18 MED ORDER — ROSUVASTATIN CALCIUM 5 MG PO TABS: 5.0000 mg | ORAL_TABLET | Freq: Every day | ORAL | 1 refills | Status: DC

## 2023-01-26 ENCOUNTER — Ambulatory Visit (INDEPENDENT_AMBULATORY_CARE_PROVIDER_SITE_OTHER): Payer: Medicare HMO

## 2023-01-26 DIAGNOSIS — R9431 Abnormal electrocardiogram [ECG] [EKG]: Secondary | ICD-10-CM

## 2023-01-26 DIAGNOSIS — R42 Dizziness and giddiness: Secondary | ICD-10-CM | POA: Diagnosis not present

## 2023-01-26 DIAGNOSIS — I10 Essential (primary) hypertension: Secondary | ICD-10-CM | POA: Diagnosis not present

## 2023-01-26 MED ORDER — IOHEXOL 350 MG/ML SOLN
100.0000 mL | Freq: Once | INTRAVENOUS | Status: AC | PRN
Start: 2023-01-26 — End: 2023-01-26
  Administered 2023-01-26: 100 mL via INTRAVENOUS

## 2023-02-02 ENCOUNTER — Ambulatory Visit (INDEPENDENT_AMBULATORY_CARE_PROVIDER_SITE_OTHER): Payer: Medicare HMO | Admitting: Cardiovascular Disease

## 2023-02-02 ENCOUNTER — Encounter: Payer: Self-pay | Admitting: Cardiovascular Disease

## 2023-02-02 VITALS — BP 112/70 | HR 73 | Ht 69.0 in | Wt 177.6 lb

## 2023-02-02 DIAGNOSIS — R42 Dizziness and giddiness: Secondary | ICD-10-CM | POA: Diagnosis not present

## 2023-02-02 DIAGNOSIS — E782 Mixed hyperlipidemia: Secondary | ICD-10-CM | POA: Diagnosis not present

## 2023-02-02 DIAGNOSIS — R9431 Abnormal electrocardiogram [ECG] [EKG]: Secondary | ICD-10-CM | POA: Diagnosis not present

## 2023-02-02 DIAGNOSIS — I10 Essential (primary) hypertension: Secondary | ICD-10-CM

## 2023-02-02 NOTE — Progress Notes (Addendum)
Cardiology Office Note   Date:  02/02/2023   ID:  Oscar Davis, DOB 04-04-1941, MRN [id number]  PCP:  Sherron Monday, MD  Cardiologist:  Adrian Blackwater, MD      History of Present Illness: Oscar Davis is a 82 y.o. male who presents for  Chief Complaint  Patient presents with   Follow-up    CCTA results/review scanned CT report    Dizziness This is a chronic problem. The current episode started in the past 7 days. The problem has been resolved.      Past Medical History:  Diagnosis Date   COPD (chronic obstructive pulmonary disease)    related to occupational exposures   Diabetes mellitus without complication    DVT (deep venous thrombosis) 07/20/2012   bilateral soleal vein   Hypertension    SDH (subdural hematoma) 06/06/2012     Past Surgical History:  Procedure Laterality Date   BRAIN SURGERY  2013   right craniotomy for evacuation of SDH, complicated by subdural empyema, s/p craniectomy, cranioplasty   COLONOSCOPY WITH PROPOFOL N/A 03/11/2020   Procedure: COLONOSCOPY WITH PROPOFOL;  Surgeon: Toledo, Boykin Nearing, MD;  Location: [facility];  Service: Gastroenterology;  Laterality: N/A;   INGUINAL HERNIA REPAIR Right 11/10/2022   Procedure: OPEN REPAIR RIGHT INGUINAL HERNIA WITH MESH;  Surgeon: Gaynelle Adu, MD;  Location: [facility];  Service: General;  Laterality: Right;  GEN AND TAP BLOCK   NASAL POLYP EXCISION       Current Outpatient Medications  Medication Sig Dispense Refill   albuterol (VENTOLIN HFA) 108 (90 Base) MCG/ACT inhaler Inhale 2 puffs into the lungs every 6 (six) hours as needed for wheezing or shortness of breath. 25.5 g 3   amLODipine (NORVASC) 10 MG tablet Take 1 tablet (10 mg total) by mouth every morning. 90 tablet 1   chlorthalidone (HYGROTON) 25 MG tablet Take 1 tablet (25 mg total) by mouth every morning. 90 tablet 1   Fluticasone-Salmeterol (ADVAIR) 250-50 MCG/DOSE AEPB Inhale 1 puff into the lungs 2 (two) times daily as needed  (shortness of breath).     fluticasone-salmeterol (WIXELA INHUB) 250-50 MCG/ACT AEPB Inhale 1 puff into the lungs in the morning and at bedtime. 180 each 1   rosuvastatin (CRESTOR) 5 MG tablet Take 1 tablet (5 mg total) by mouth daily. 90 tablet 1   No current facility-administered medications for this visit.    Allergies:   Prednisone    Social History:   reports that he has never smoked. He has never used smokeless tobacco. He reports that he does not drink alcohol and does not use drugs.   Family History:  family history includes Alcoholism in his father.    ROS:     Review of Systems  Constitutional: Negative.   HENT: Negative.    Eyes: Negative.   Respiratory: Negative.    Gastrointestinal: Negative.   Genitourinary: Negative.   Musculoskeletal: Negative.   Skin: Negative.   Neurological:  Positive for dizziness.  Endo/Heme/Allergies: Negative.   Psychiatric/Behavioral: Negative.    All other systems reviewed and are negative.     All other systems are reviewed and negative.    PHYSICAL EXAM: VS:  BP 112/70   Pulse 73   Ht  (1.753 m)   Wt 177 lb 9.6 oz (80.6 kg)   SpO2 96%   BMI 26.23 kg/m  , BMI Body mass index is 26.23 kg/m. Last weight:  Wt Readings from Last 3 Encounters:  02/02/23 177  lb 9.6 oz (80.6 kg)  01/05/23 183 lb (83 kg)  12/15/22 182 lb 12.8 oz (82.9 kg)     Physical Exam Vitals reviewed.  Constitutional:      Appearance: Normal appearance. He is normal weight.  HENT:     Head: Normocephalic.     Nose: Nose normal.     Mouth/Throat:     Mouth: Mucous membranes are moist.  Eyes:     Pupils: Pupils are equal, round, and reactive to light.  Cardiovascular:     Rate and Rhythm: Normal rate and regular rhythm.     Pulses: Normal pulses.     Heart sounds: Normal heart sounds.  Pulmonary:     Effort: Pulmonary effort is normal.  Abdominal:     General: Abdomen is flat. Bowel sounds are normal.  Musculoskeletal:        General:  Normal range of motion.     Cervical back: Normal range of motion.  Skin:    General: Skin is warm.  Neurological:     General: No focal deficit present.     Mental Status: He is alert.  Psychiatric:        Mood and Affect: Mood normal.       EKG:   Recent Labs: 11/04/2022: Hemoglobin 14.1; Platelets 252 01/05/2023: BUN 15; Creatinine, Ser 1.18; Potassium 4.5; Sodium 138    Lipid Panel No results found for: "CHOL", "TRIG", "HDL", "CHOLHDL", "VLDL", "LDLCALC", "LDLDIRECT"    ASSESSMENT AND PLAN:    ICD-10-CM   1. Primary hypertension  I10     2. Abnormal EKG  R94.31    CCTA showed normal coronaries.    3. Dizziness  R42     4. Mixed hyperlipidemia  E78.2        Problem List Items Addressed This Visit       Cardiovascular and Mediastinum   Primary hypertension - Primary     Other   Abnormal EKG   Dizziness   Mixed hyperlipidemia    Disposition:   Return in about 3 months (around 05/04/2023).    Total time spent: 35 minutes  Signed,  Adrian Blackwater, MD  02/02/2023 9:46 AM    [facility]

## 2023-02-13 IMAGING — DX DG ANKLE COMPLETE 3+V*R*
3 series · 3 of 3 positions shown · non-contrast
Comparison: None.

CLINICAL DATA: Twisted right ankle 2 days ago.

EXAM:
RIGHT ANKLE - COMPLETE 3+ VIEW

[ankle ap]
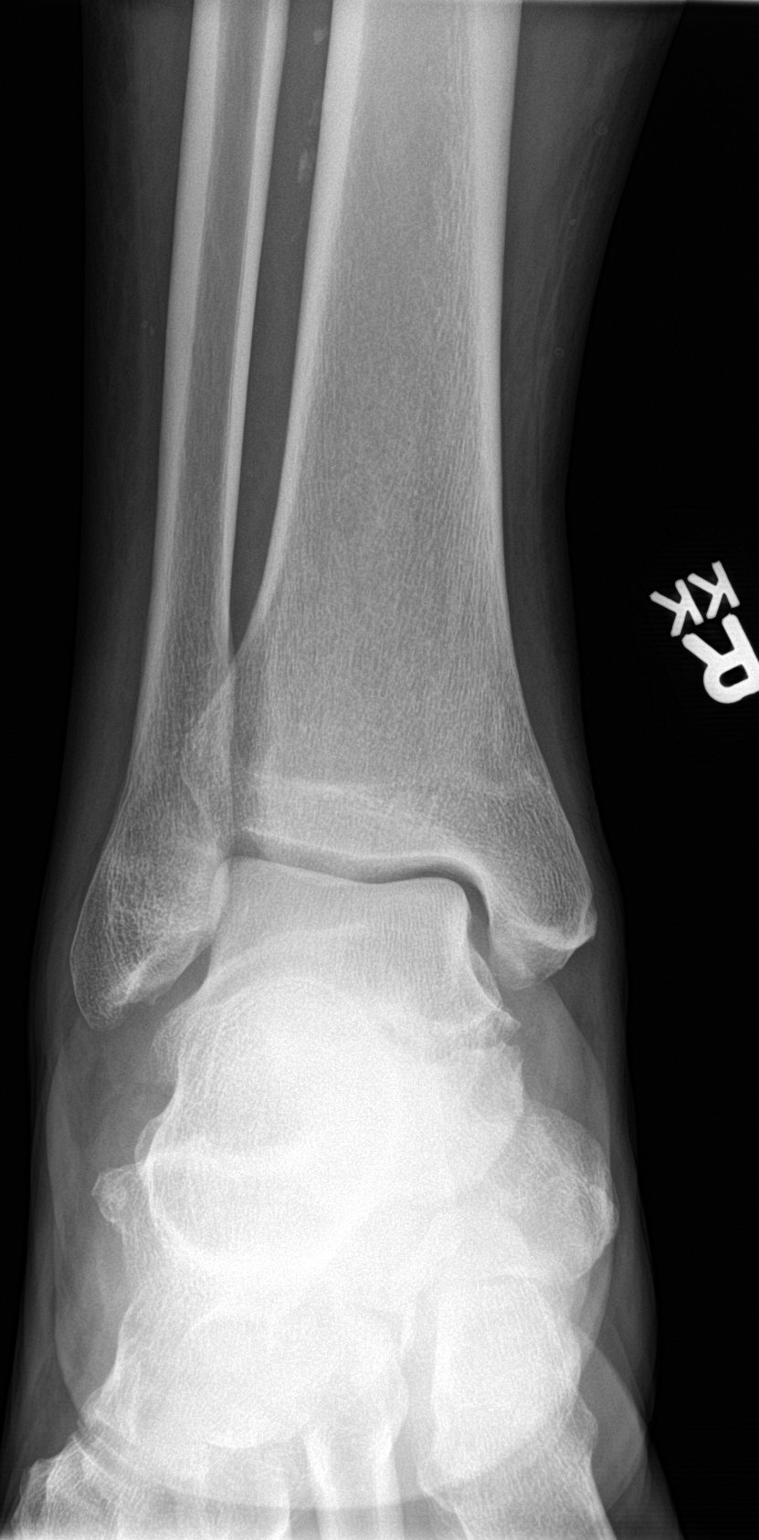

[ankle obl]
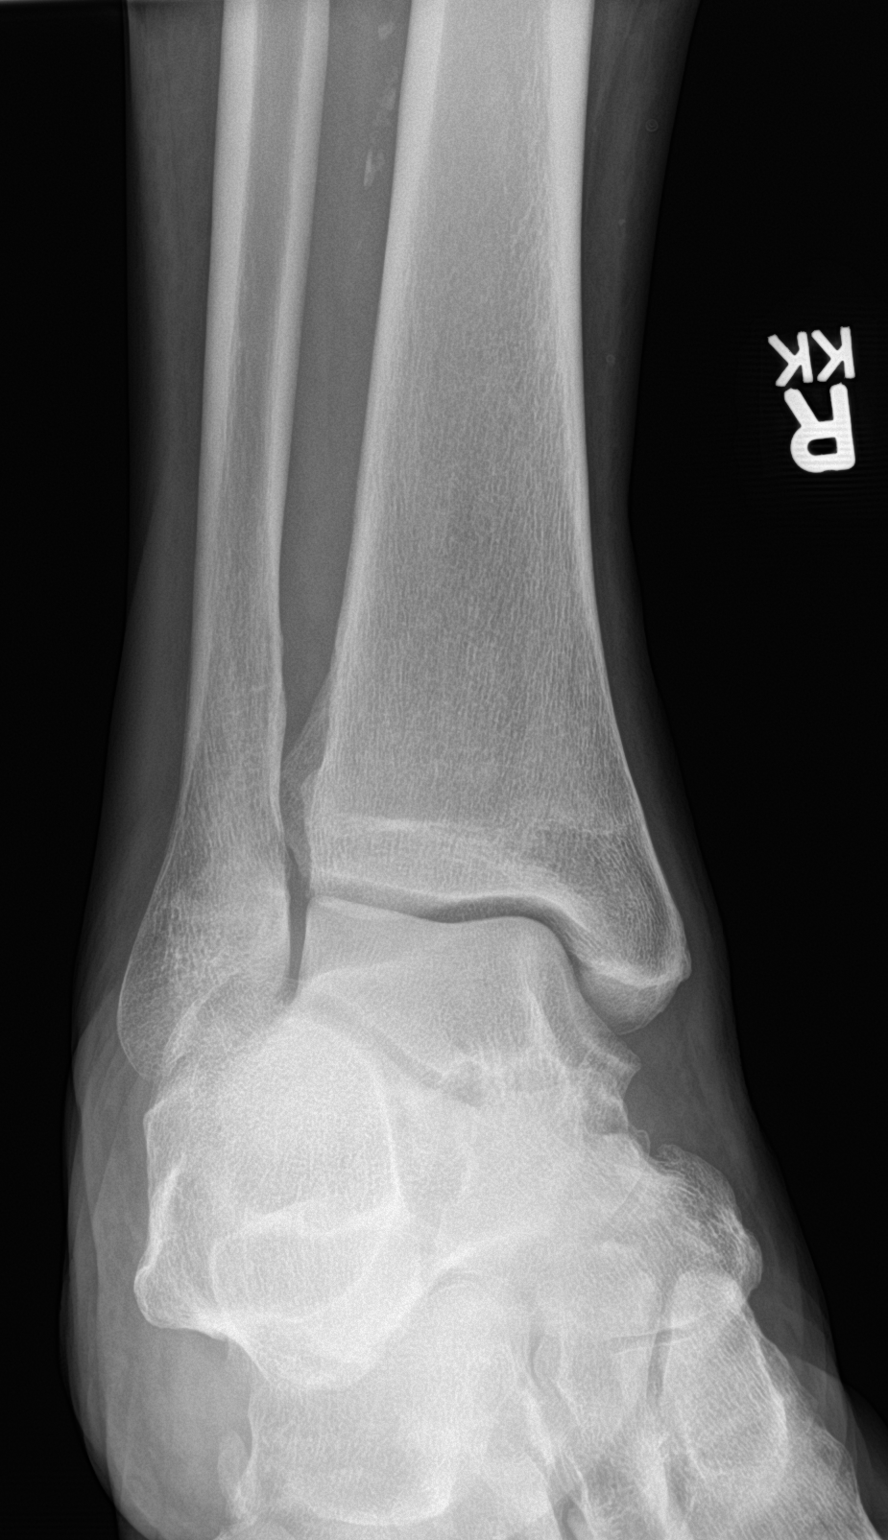

[ankle lat]
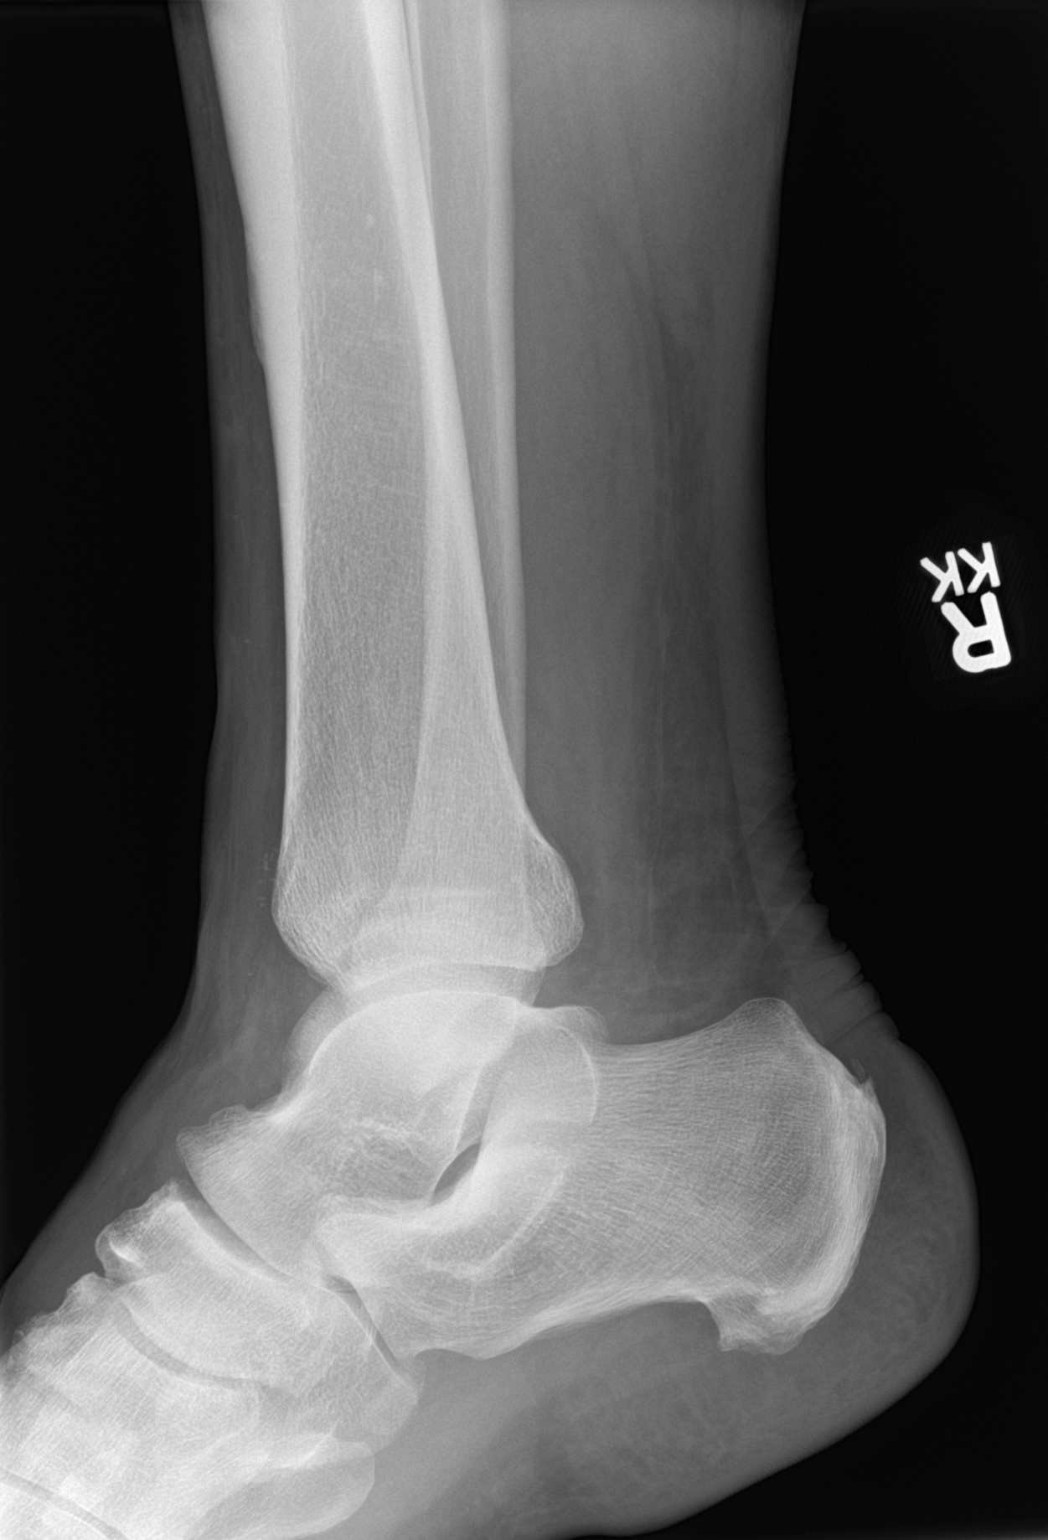

[3 of 3 positions shown; findings below may reference images not displayed]

FINDINGS: There is no evidence of fracture, dislocation, or joint effusion.
There is no evidence of arthropathy or other focal bone abnormality.
Small heel spurs. Soft tissues are unremarkable.
IMPRESSION: 1. No acute findings.
2. Heel spurs.

## 2023-02-18 ENCOUNTER — Encounter: Payer: Self-pay | Admitting: Podiatry

## 2023-02-18 ENCOUNTER — Ambulatory Visit: Payer: Medicare HMO | Admitting: Podiatry

## 2023-02-18 DIAGNOSIS — L84 Corns and callosities: Secondary | ICD-10-CM | POA: Diagnosis not present

## 2023-02-18 DIAGNOSIS — B351 Tinea unguium: Secondary | ICD-10-CM

## 2023-02-18 DIAGNOSIS — M2042 Other hammer toe(s) (acquired), left foot: Secondary | ICD-10-CM | POA: Diagnosis not present

## 2023-02-18 DIAGNOSIS — M2041 Other hammer toe(s) (acquired), right foot: Secondary | ICD-10-CM | POA: Diagnosis not present

## 2023-02-18 DIAGNOSIS — M79675 Pain in left toe(s): Secondary | ICD-10-CM | POA: Diagnosis not present

## 2023-02-18 DIAGNOSIS — M79674 Pain in right toe(s): Secondary | ICD-10-CM | POA: Diagnosis not present

## 2023-02-18 NOTE — Patient Instructions (Signed)
More silicone pads can be purchased from:  [email or web address]  

## 2023-02-18 NOTE — Progress Notes (Signed)
  Subjective:  Patient ID: Oscar Davis, male    DOB: November 06, 1940,  MRN: [id number]  Chief Complaint  Patient presents with   Callouses    np - left foot under 5th toe painful growth    82 y.o. male presents with the above complaint. History confirmed with patient.  His nails are also thickened elongated his wife has difficulty cutting them, he says they cause discomfort in his shoes.  Objective:  Physical Exam: warm, good capillary refill, no trophic changes or ulcerative lesions, normal DP and PT pulses, and normal sensory exam. Left Foot: dystrophic yellowed discolored nail plates with subungual debris and adductovarus fifth hammertoe with medial facing corn Right Foot: dystrophic yellowed discolored nail plates with subungual debris  Assessment:   1. Pain due to onychomycosis of toenails of both feet   2. Callus of foot   3. Hammertoe of left foot   4. Hammertoe of right foot      Plan:  Patient was evaluated and treated and all questions answered.  Discussed the etiology and treatment options for the condition in detail with the patient. Recommended debridement of the nails today. Sharp and mechanical debridement performed of all painful and mycotic nails today. Nails debrided in length and thickness using a nail nipper to level of comfort. Discussed treatment options including appropriate shoe gear. Follow up as needed for painful nails.  All symptomatic hyperkeratoses were safely debrided as a courtesy with a sterile #15 blade to patient's level of comfort without incident. We discussed preventative and palliative care of these lesions including supportive and accommodative shoegear, padding, prefabricated and custom molded accommodative orthoses, use of a pumice stone and lotions/creams daily.  I recommended utilizing offloading silicone corn pad.  This was dispensed.  They will purchase these online or at the office.   Return in about 3 months (around 05/21/2023) for painful  thick fungal nails.

## 2023-03-12 ENCOUNTER — Other Ambulatory Visit: Payer: Self-pay | Admitting: Internal Medicine

## 2023-03-12 ENCOUNTER — Telehealth: Payer: Self-pay

## 2023-03-12 MED ORDER — SILDENAFIL CITRATE 100 MG PO TABS: 100.0000 mg | ORAL_TABLET | ORAL | 1 refills | Status: DC | PRN

## 2023-03-12 NOTE — Telephone Encounter (Signed)
Patient called and asked if you can send him in an rx for Viagra

## 2023-03-18 DIAGNOSIS — H903 Sensorineural hearing loss, bilateral: Secondary | ICD-10-CM | POA: Diagnosis not present

## 2023-05-20 ENCOUNTER — Ambulatory Visit: Payer: Medicare HMO | Admitting: Podiatry

## 2023-05-25 ENCOUNTER — Ambulatory Visit: Payer: Medicare HMO | Admitting: Cardiovascular Disease

## 2023-08-16 ENCOUNTER — Encounter: Payer: Self-pay | Admitting: Internal Medicine

## 2023-08-16 ENCOUNTER — Ambulatory Visit (INDEPENDENT_AMBULATORY_CARE_PROVIDER_SITE_OTHER): Payer: Medicare HMO | Admitting: Internal Medicine

## 2023-08-16 VITALS — BP 130/70 | HR 70 | Ht 69.0 in | Wt 180.6 lb

## 2023-08-16 DIAGNOSIS — E782 Mixed hyperlipidemia: Secondary | ICD-10-CM | POA: Diagnosis not present

## 2023-08-16 DIAGNOSIS — N528 Other male erectile dysfunction: Secondary | ICD-10-CM

## 2023-08-16 DIAGNOSIS — I10 Essential (primary) hypertension: Secondary | ICD-10-CM | POA: Diagnosis not present

## 2023-08-16 DIAGNOSIS — J4540 Moderate persistent asthma, uncomplicated: Secondary | ICD-10-CM | POA: Insufficient documentation

## 2023-08-16 MED ORDER — AMLODIPINE BESYLATE 10 MG PO TABS: 10.0000 mg | ORAL_TABLET | Freq: Every morning | ORAL | 1 refills | Status: DC

## 2023-08-16 MED ORDER — ALBUTEROL SULFATE HFA 108 (90 BASE) MCG/ACT IN AERS
2.0000 | INHALATION_SPRAY | Freq: Four times a day (QID) | RESPIRATORY_TRACT | 3 refills | Status: AC | PRN
Start: 2023-08-16 — End: ?

## 2023-08-16 MED ORDER — CHLORTHALIDONE 25 MG PO TABS: 25.0000 mg | ORAL_TABLET | Freq: Every morning | ORAL | 1 refills | Status: DC

## 2023-08-16 MED ORDER — SILDENAFIL CITRATE 100 MG PO TABS: 100.0000 mg | ORAL_TABLET | ORAL | 1 refills | Status: DC | PRN

## 2023-08-16 MED ORDER — FLUTICASONE-SALMETEROL 250-50 MCG/ACT IN AEPB: 1.0000 | INHALATION_SPRAY | Freq: Two times a day (BID) | RESPIRATORY_TRACT | 1 refills | Status: DC

## 2023-08-16 MED ORDER — ROSUVASTATIN CALCIUM 5 MG PO TABS: 5.0000 mg | ORAL_TABLET | Freq: Every day | ORAL | 1 refills | Status: DC

## 2023-08-16 NOTE — Progress Notes (Signed)
Established Patient Office Visit  Subjective:  Patient ID: Oscar Davis, male    DOB: [birth year]  Age: 82 y.o. MRN: [id number]  Chief Complaint  Patient presents with   Follow-up    Med refill    No new complaints, here for lab review and medication refills.     No other concerns at this time.   Past Medical History:  Diagnosis Date   COPD (chronic obstructive pulmonary disease) (HCC)    related to occupational exposures   Diabetes mellitus without complication (HCC)    DVT (deep venous thrombosis) (HCC) 07/20/2012   bilateral soleal vein   Hypertension    SDH (subdural hematoma) (HCC) 06/06/2012    Past Surgical History:  Procedure Laterality Date   BRAIN SURGERY  2013   right craniotomy for evacuation of SDH, complicated by subdural empyema, Vincient Vanaman/p craniectomy, cranioplasty   COLONOSCOPY WITH PROPOFOL N/A 03/11/2020   Procedure: COLONOSCOPY WITH PROPOFOL;  Surgeon: Toledo, Boykin Nearing, MD;  Location: [facility];  Service: Gastroenterology;  Laterality: N/A;   INGUINAL HERNIA REPAIR Right 11/10/2022   Procedure: OPEN REPAIR RIGHT INGUINAL HERNIA WITH MESH;  Surgeon: Gaynelle Adu, MD;  Location: [facility];  Service: General;  Laterality: Right;  GEN AND TAP BLOCK   NASAL POLYP EXCISION      Social History   Socioeconomic History   Marital status: Married    Spouse name: Not on file   Number of children: Not on file   Years of education: Not on file   Highest education level: Not on file  Occupational History   Not on file  Tobacco Use   Smoking status: Never   Smokeless tobacco: Never  Vaping Use   Vaping status: Never Used  Substance and Sexual Activity   Alcohol use: No   Drug use: No   Sexual activity: Not on file  Other Topics Concern   Not on file  Social History Narrative   Not on file   Social Determinants of Health   Financial Resource Strain: Not on file  Food Insecurity: Not on file  Transportation Needs: Not on file  Physical Activity:  Not on file  Stress: Not on file  Social Connections: Not on file  Intimate Partner Violence: Not on file    Family History  Problem Relation Age of Onset   Alcoholism Father     Allergies  Allergen Reactions   Prednisone Other (See Comments)    Muscle pain    Review of Systems  Constitutional: Negative.  Negative for malaise/fatigue.  HENT: Negative.    Eyes: Negative.   Respiratory:  Positive for wheezing (rarely). Negative for cough.   Cardiovascular: Negative.  Negative for chest pain and palpitations.  Gastrointestinal: Negative.   Genitourinary: Negative.   Musculoskeletal:  Negative for back pain and neck pain.  Skin: Negative.   Neurological: Negative.  Negative for dizziness.  Endo/Heme/Allergies: Negative.        Objective:   BP 130/70   Pulse 70   Ht 5\' 9"  (1.753 m)   Wt 180 lb 9.6 oz (81.9 kg)   SpO2 98%   BMI 26.67 kg/m   Vitals:   08/16/23 1536  BP: 130/70  Pulse: 70  Height: 5\' 9"  (1.753 m)  Weight: 180 lb 9.6 oz (81.9 kg)  SpO2: 98%  BMI (Calculated): 26.66    Physical Exam Vitals reviewed.  Constitutional:      Appearance: Normal appearance.  HENT:     Head: Normocephalic.  Left Ear: There is no impacted cerumen.     Nose: Nose normal.     Mouth/Throat:     Mouth: Mucous membranes are moist.     Pharynx: No posterior oropharyngeal erythema.  Eyes:     Extraocular Movements: Extraocular movements intact.     Pupils: Pupils are equal, round, and reactive to light.  Cardiovascular:     Rate and Rhythm: Regular rhythm.     Chest Wall: PMI is not displaced.     Pulses: Normal pulses.     Heart sounds: Normal heart sounds. No murmur heard. Pulmonary:     Effort: Pulmonary effort is normal.     Breath sounds: Normal air entry. No rhonchi or rales.  Abdominal:     General: Abdomen is flat. Bowel sounds are normal. There is no distension.     Palpations: Abdomen is soft. There is no hepatomegaly, splenomegaly or mass.      Tenderness: There is no abdominal tenderness.  Musculoskeletal:        General: Normal range of motion.     Cervical back: Normal range of motion and neck supple.     Right lower leg: No edema.     Left lower leg: No edema.  Skin:    General: Skin is warm and dry.  Neurological:     General: No focal deficit present.     Mental Status: He is alert and oriented to person, place, and time.     Cranial Nerves: No cranial nerve deficit.     Motor: No weakness.  Psychiatric:        Mood and Affect: Mood normal.        Behavior: Behavior normal.      No results found for any visits on 08/16/23.  No results found for this or any previous visit (from the past 2160 hour(Ange Puskas)).    Assessment & Plan:  As per problem list  Problem List Items Addressed This Visit       Cardiovascular and Mediastinum   Primary hypertension - Primary   Relevant Medications   amLODipine (NORVASC) 10 MG tablet   chlorthalidone (HYGROTON) 25 MG tablet   rosuvastatin (CRESTOR) 5 MG tablet   sildenafil (VIAGRA) 100 MG tablet   Other Relevant Orders   CBC With Diff/Platelet   Comprehensive metabolic panel     Respiratory   Moderate persistent asthma without complication   Relevant Medications   albuterol (VENTOLIN HFA) 108 (90 Base) MCG/ACT inhaler   fluticasone-salmeterol (WIXELA INHUB) 250-50 MCG/ACT AEPB     Other   Mixed hyperlipidemia   Relevant Medications   albuterol (VENTOLIN HFA) 108 (90 Base) MCG/ACT inhaler   amLODipine (NORVASC) 10 MG tablet   chlorthalidone (HYGROTON) 25 MG tablet   rosuvastatin (CRESTOR) 5 MG tablet   sildenafil (VIAGRA) 100 MG tablet   Other Relevant Orders   Lipid panel   Other Visit Diagnoses     Other male erectile dysfunction       Relevant Medications   sildenafil (VIAGRA) 100 MG tablet       Return in about 4 weeks (around 09/13/2023) for awv with labs prior.   Total time spent: 20 minutes  Luna Fuse, MD  08/16/2023   This  document may have been prepared by [facility] Voice Recognition software and as such may include unintentional dictation errors.

## 2023-09-01 ENCOUNTER — Telehealth: Payer: Self-pay

## 2023-09-15 NOTE — Telephone Encounter (Signed)
error 

## 2023-09-28 ENCOUNTER — Ambulatory Visit (INDEPENDENT_AMBULATORY_CARE_PROVIDER_SITE_OTHER): Payer: Medicare HMO | Admitting: Internal Medicine

## 2023-09-28 ENCOUNTER — Encounter: Payer: Self-pay | Admitting: Internal Medicine

## 2023-09-28 VITALS — BP 123/72 | HR 64 | Ht 70.0 in | Wt 180.4 lb

## 2023-09-28 DIAGNOSIS — I10 Essential (primary) hypertension: Secondary | ICD-10-CM

## 2023-09-28 DIAGNOSIS — Z0001 Encounter for general adult medical examination with abnormal findings: Secondary | ICD-10-CM | POA: Diagnosis not present

## 2023-09-28 DIAGNOSIS — N528 Other male erectile dysfunction: Secondary | ICD-10-CM | POA: Diagnosis not present

## 2023-09-28 DIAGNOSIS — Z1331 Encounter for screening for depression: Secondary | ICD-10-CM | POA: Diagnosis not present

## 2023-09-28 DIAGNOSIS — E782 Mixed hyperlipidemia: Secondary | ICD-10-CM

## 2023-09-28 MED ORDER — SILDENAFIL CITRATE 100 MG PO TABS: 100.0000 mg | ORAL_TABLET | ORAL | 1 refills | Status: DC | PRN

## 2023-09-28 NOTE — Progress Notes (Signed)
Established Patient Office Visit  Subjective:  Patient ID: Oscar Davis, male    DOB: 12/20/1940  Age: 82 y.o. MRN: [id number]  No chief complaint on file.   No new complaints, here for AWV refer to quality metrics and scanned documents.     No other concerns at this time.   Past Medical History:  Diagnosis Date   COPD (chronic obstructive pulmonary disease) (HCC)    related to occupational exposures   Diabetes mellitus without complication (HCC)    DVT (deep venous thrombosis) (HCC) 07/20/2012   bilateral soleal vein   Hypertension    SDH (subdural hematoma) (HCC) 06/06/2012    Past Surgical History:  Procedure Laterality Date   BRAIN SURGERY  2013   right craniotomy for evacuation of SDH, complicated by subdural empyema, Wilmore Holsomback/p craniectomy, cranioplasty   COLONOSCOPY WITH PROPOFOL N/A 03/11/2020   Procedure: COLONOSCOPY WITH PROPOFOL;  Surgeon: Toledo, Boykin Nearing, MD;  Location: [facility];  Service: Gastroenterology;  Laterality: N/A;   INGUINAL HERNIA REPAIR Right 11/10/2022   Procedure: OPEN REPAIR RIGHT INGUINAL HERNIA WITH MESH;  Surgeon: Gaynelle Adu, MD;  Location: [facility];  Service: General;  Laterality: Right;  GEN AND TAP BLOCK   NASAL POLYP EXCISION      Social History   Socioeconomic History   Marital status: Married    Spouse name: Not on file   Number of children: Not on file   Years of education: Not on file   Highest education level: Not on file  Occupational History   Not on file  Tobacco Use   Smoking status: Never   Smokeless tobacco: Never  Vaping Use   Vaping status: Never Used  Substance and Sexual Activity   Alcohol use: No   Drug use: No   Sexual activity: Not on file  Other Topics Concern   Not on file  Social History Narrative   Not on file   Social Determinants of Health   Financial Resource Strain: Not on file  Food Insecurity: Not on file  Transportation Needs: Not on file  Physical Activity: Not on file  Stress: Not  on file  Social Connections: Not on file  Intimate Partner Violence: Not on file    Family History  Problem Relation Age of Onset   Alcoholism Father     Allergies  Allergen Reactions   Prednisone Other (See Comments)    Muscle pain    Outpatient Medications Prior to Visit  Medication Sig   albuterol (VENTOLIN HFA) 108 (90 Base) MCG/ACT inhaler Inhale 2 puffs into the lungs every 6 (six) hours as needed for wheezing or shortness of breath.   amLODipine (NORVASC) 10 MG tablet Take 1 tablet (10 mg total) by mouth every morning.   chlorthalidone (HYGROTON) 25 MG tablet Take 1 tablet (25 mg total) by mouth every morning.   Fluticasone-Salmeterol (ADVAIR) 250-50 MCG/DOSE AEPB Inhale 1 puff into the lungs 2 (two) times daily as needed (shortness of breath).   fluticasone-salmeterol (WIXELA INHUB) 250-50 MCG/ACT AEPB Inhale 1 puff into the lungs in the morning and at bedtime.   rosuvastatin (CRESTOR) 5 MG tablet Take 1 tablet (5 mg total) by mouth daily.   [DISCONTINUED] sildenafil (VIAGRA) 100 MG tablet Take 1 tablet (100 mg total) by mouth as needed for erectile dysfunction (daily).   No facility-administered medications prior to visit.    Review of Systems  Constitutional: Negative.  Negative for malaise/fatigue.  HENT: Negative.    Eyes: Negative.   Respiratory:  Negative for cough. Wheezing: rarely.  Cardiovascular: Negative.  Negative for chest pain and palpitations.  Gastrointestinal: Negative.   Genitourinary: Negative.   Musculoskeletal:  Negative for back pain and neck pain.  Skin: Negative.   Neurological: Negative.  Negative for dizziness.  Endo/Heme/Allergies: Negative.        Objective:   BP 123/72   Pulse 64   Ht 5\' 10"  (1.778 m)   Wt 180 lb 6.4 oz (81.8 kg)   SpO2 96%   BMI 25.88 kg/m   Vitals:   09/28/23 1039  BP: 123/72  Pulse: 64  Height: 5\' 10"  (1.778 m)  Weight: 180 lb 6.4 oz (81.8 kg)  SpO2: 96%  BMI (Calculated): 25.88    Physical  Exam Vitals reviewed.  Constitutional:      Appearance: Normal appearance.  HENT:     Head: Normocephalic.     Left Ear: There is no impacted cerumen.     Nose: Nose normal.     Mouth/Throat:     Mouth: Mucous membranes are moist.     Pharynx: No posterior oropharyngeal erythema.  Eyes:     Extraocular Movements: Extraocular movements intact.     Pupils: Pupils are equal, round, and reactive to light.  Cardiovascular:     Rate and Rhythm: Regular rhythm.     Chest Wall: PMI is not displaced.     Pulses: Normal pulses.     Heart sounds: Normal heart sounds. No murmur heard. Pulmonary:     Effort: Pulmonary effort is normal.     Breath sounds: Normal air entry. No rhonchi or rales.  Abdominal:     General: Abdomen is flat. Bowel sounds are normal. There is no distension.     Palpations: Abdomen is soft. There is no hepatomegaly, splenomegaly or mass.     Tenderness: There is no abdominal tenderness.  Musculoskeletal:        General: Normal range of motion.     Cervical back: Normal range of motion and neck supple.     Right lower leg: No edema.     Left lower leg: No edema.  Skin:    General: Skin is warm and dry.  Neurological:     General: No focal deficit present.     Mental Status: He is alert and oriented to person, place, and time.     Cranial Nerves: No cranial nerve deficit.     Motor: No weakness.  Psychiatric:        Mood and Affect: Mood normal.        Behavior: Behavior normal.      No results found for any visits on 09/28/23.  No results found for this or any previous visit (from the past 2160 hour(Fradel Baldonado)).    Assessment & Plan:  As per problem list.  Problem List Items Addressed This Visit       Cardiovascular and Mediastinum   Primary hypertension - Primary   Relevant Medications   sildenafil (VIAGRA) 100 MG tablet   Other Relevant Orders   CBC With Diff/Platelet     Other   Mixed hyperlipidemia   Relevant Medications   sildenafil (VIAGRA)  100 MG tablet   Other Relevant Orders   Comprehensive metabolic panel   Lipid panel   Other Visit Diagnoses     Other male erectile dysfunction       Relevant Medications   sildenafil (VIAGRA) 100 MG tablet       Return in about 4 months (around  01/27/2024) for refills.   Total time spent: 30 minutes  Luna Fuse, MD  09/28/2023   This document may have been prepared by [facility] Voice Recognition software and as such may include unintentional dictation errors.

## 2023-09-29 LAB — CBC WITH DIFF/PLATELET
Basophils Absolute: 0.1 10*3/uL (ref 0.0–0.2)
Basos: 2 %
EOS (ABSOLUTE): 0.7 10*3/uL — ABNORMAL HIGH (ref 0.0–0.4)
Eos: 12 %
Hematocrit: 46.3 % (ref 37.5–51.0)
Hemoglobin: 15.1 g/dL (ref 13.0–17.7)
Immature Grans (Abs): 0 10*3/uL (ref 0.0–0.1)
Immature Granulocytes: 0 %
Lymphocytes Absolute: 1.2 10*3/uL (ref 0.7–3.1)
Lymphs: 20 %
MCH: 29 pg (ref 26.6–33.0)
MCHC: 32.6 g/dL (ref 31.5–35.7)
MCV: 89 fL (ref 79–97)
Monocytes Absolute: 0.5 10*3/uL (ref 0.1–0.9)
Monocytes: 8 %
Neutrophils Absolute: 3.5 10*3/uL (ref 1.4–7.0)
Neutrophils: 58 %
Platelets: 219 10*3/uL (ref 150–450)
RBC: 5.2 x10E6/uL (ref 4.14–5.80)
RDW: 12.1 % (ref 11.6–15.4)
WBC: 6 10*3/uL (ref 3.4–10.8)

## 2023-09-29 LAB — LIPID PANEL
Chol/HDL Ratio: 2.4 {ratio} (ref 0.0–5.0)
Cholesterol, Total: 199 mg/dL (ref 100–199)
HDL: 83 mg/dL (ref >39)
LDL Chol Calc (NIH): 107 mg/dL — ABNORMAL HIGH (ref 0–99)
Triglycerides: 45 mg/dL (ref 0–149)
VLDL Cholesterol Cal: 9 mg/dL (ref 5–40)

## 2023-09-29 LAB — COMPREHENSIVE METABOLIC PANEL
ALT: 36 [IU]/L (ref 0–44)
AST: 26 [IU]/L (ref 0–40)
Albumin: 4.2 g/dL (ref 3.7–4.7)
Alkaline Phosphatase: 94 [IU]/L (ref 44–121)
BUN/Creatinine Ratio: 16 (ref 10–24)
BUN: 18 mg/dL (ref 8–27)
Bilirubin Total: 0.9 mg/dL (ref 0.0–1.2)
CO2: 27 mmol/L (ref 20–29)
Calcium: 9.5 mg/dL (ref 8.6–10.2)
Chloride: 101 mmol/L (ref 96–106)
Creatinine, Ser: 1.12 mg/dL (ref 0.76–1.27)
Globulin, Total: 2.9 g/dL (ref 1.5–4.5)
Glucose: 95 mg/dL (ref 70–99)
Potassium: 5 mmol/L (ref 3.5–5.2)
Sodium: 142 mmol/L (ref 134–144)
Total Protein: 7.1 g/dL (ref 6.0–8.5)
eGFR: 66 mL/min/{1.73_m2} (ref >59)

## 2023-10-06 ENCOUNTER — Other Ambulatory Visit: Payer: Self-pay | Admitting: Internal Medicine

## 2023-10-06 DIAGNOSIS — I679 Cerebrovascular disease, unspecified: Secondary | ICD-10-CM

## 2023-10-06 MED ORDER — ROSUVASTATIN CALCIUM 10 MG PO TABS: 10.0000 mg | ORAL_TABLET | Freq: Every day | ORAL | 3 refills | Status: AC

## 2024-01-31 ENCOUNTER — Ambulatory Visit: Payer: Medicare HMO | Admitting: Internal Medicine

## 2024-01-31 ENCOUNTER — Other Ambulatory Visit

## 2024-01-31 DIAGNOSIS — R7301 Impaired fasting glucose: Secondary | ICD-10-CM | POA: Diagnosis not present

## 2024-01-31 DIAGNOSIS — E782 Mixed hyperlipidemia: Secondary | ICD-10-CM | POA: Diagnosis not present

## 2024-01-31 DIAGNOSIS — I10 Essential (primary) hypertension: Secondary | ICD-10-CM | POA: Diagnosis not present

## 2024-02-01 LAB — CMP14+EGFR
ALT: 29 IU/L (ref 0–44)
AST: 32 IU/L (ref 0–40)
Albumin: 3.9 g/dL (ref 3.7–4.7)
Alkaline Phosphatase: 87 IU/L (ref 44–121)
BUN/Creatinine Ratio: 17 (ref 10–24)
BUN: 17 mg/dL (ref 8–27)
Bilirubin Total: 0.9 mg/dL (ref 0.0–1.2)
CO2: 25 mmol/L (ref 20–29)
Calcium: 8.9 mg/dL (ref 8.6–10.2)
Chloride: 99 mmol/L (ref 96–106)
Creatinine, Ser: 1.01 mg/dL (ref 0.76–1.27)
Globulin, Total: 2.4 g/dL (ref 1.5–4.5)
Glucose: 88 mg/dL (ref 70–99)
Potassium: 4.2 mmol/L (ref 3.5–5.2)
Sodium: 137 mmol/L (ref 134–144)
Total Protein: 6.3 g/dL (ref 6.0–8.5)
eGFR: 74 mL/min/{1.73_m2} (ref >59)

## 2024-02-01 LAB — TSH: TSH: 2.57 u[IU]/mL (ref 0.450–4.500)

## 2024-02-01 LAB — LIPID PANEL
Chol/HDL Ratio: 2.3 ratio (ref 0.0–5.0)
Cholesterol, Total: 168 mg/dL (ref 100–199)
HDL: 74 mg/dL (ref >39)
LDL Chol Calc (NIH): 84 mg/dL (ref 0–99)
Triglycerides: 46 mg/dL (ref 0–149)
VLDL Cholesterol Cal: 10 mg/dL (ref 5–40)

## 2024-02-01 LAB — HEMOGLOBIN A1C
Est. average glucose Bld gHb Est-mCnc: 117 mg/dL
Hgb A1c MFr Bld: 5.7 % — ABNORMAL HIGH (ref 4.8–5.6)

## 2024-02-21 ENCOUNTER — Ambulatory Visit (INDEPENDENT_AMBULATORY_CARE_PROVIDER_SITE_OTHER): Admitting: Internal Medicine

## 2024-02-21 ENCOUNTER — Other Ambulatory Visit

## 2024-02-21 ENCOUNTER — Encounter: Payer: Self-pay | Admitting: Internal Medicine

## 2024-02-21 VITALS — BP 130/80 | HR 61 | Temp 96.8°F | Ht 70.0 in | Wt 177.2 lb

## 2024-02-21 DIAGNOSIS — N528 Other male erectile dysfunction: Secondary | ICD-10-CM

## 2024-02-21 DIAGNOSIS — I10 Essential (primary) hypertension: Secondary | ICD-10-CM | POA: Diagnosis not present

## 2024-02-21 DIAGNOSIS — J4540 Moderate persistent asthma, uncomplicated: Secondary | ICD-10-CM

## 2024-02-21 DIAGNOSIS — E782 Mixed hyperlipidemia: Secondary | ICD-10-CM | POA: Diagnosis not present

## 2024-02-21 DIAGNOSIS — R7303 Prediabetes: Secondary | ICD-10-CM

## 2024-02-21 MED ORDER — CHLORTHALIDONE 25 MG PO TABS: 25.0000 mg | ORAL_TABLET | Freq: Every morning | ORAL | 1 refills | Status: AC

## 2024-02-21 MED ORDER — FLUTICASONE-SALMETEROL 250-50 MCG/ACT IN AEPB: 1.0000 | INHALATION_SPRAY | Freq: Two times a day (BID) | RESPIRATORY_TRACT | 1 refills | Status: AC

## 2024-02-21 MED ORDER — SILDENAFIL CITRATE 100 MG PO TABS: 100.0000 mg | ORAL_TABLET | ORAL | 1 refills | Status: AC | PRN

## 2024-02-21 MED ORDER — AMLODIPINE BESYLATE 10 MG PO TABS: 10.0000 mg | ORAL_TABLET | Freq: Every morning | ORAL | 1 refills | Status: AC

## 2024-02-21 NOTE — Progress Notes (Addendum)
 Established Patient Office Visit  Subjective:  Patient ID: Oscar Davis , male    DOB: Feb 21, 1941  Age: 83 y.o. MRN: [id number]  Chief Complaint  Patient presents with   Follow-up    4 month medication refill    No new complaints, here for medication refills. LDL and TC well controlled on lab review. Triglycerides also satisfactory with unremarkable cmp.     No other concerns at this time.   Past Medical History:  Diagnosis Date   COPD (chronic obstructive pulmonary disease) (HCC)    related to occupational exposures   Diabetes mellitus without complication (HCC)    DVT (deep venous thrombosis) (HCC) 07/20/2012   bilateral soleal vein   Hypertension    SDH (subdural hematoma) (HCC) 06/06/2012    Past Surgical History:  Procedure Laterality Date   BRAIN SURGERY  2013   right craniotomy for evacuation of SDH, complicated by subdural empyema, Margaree Sandhu/p craniectomy, cranioplasty   COLONOSCOPY WITH PROPOFOL  N/A 03/11/2020   Procedure: COLONOSCOPY WITH PROPOFOL ;  Surgeon: Toledo, Alphonsus Jeans, MD;  Location: [facility];  Service: Gastroenterology;  Laterality: N/A;   INGUINAL HERNIA REPAIR Right 11/10/2022   Procedure: OPEN REPAIR RIGHT INGUINAL HERNIA WITH MESH;  Surgeon: Aldean Hummingbird, MD;  Location: [facility];  Service: General;  Laterality: Right;  GEN AND TAP BLOCK   NASAL POLYP EXCISION      Social History   Socioeconomic History   Marital status: Married    Spouse name: Not on file   Number of children: Not on file   Years of education: Not on file   Highest education level: Not on file  Occupational History   Not on file  Tobacco Use   Smoking status: Never   Smokeless tobacco: Never  Vaping Use   Vaping status: Never Used  Substance and Sexual Activity   Alcohol use: No   Drug use: No   Sexual activity: Not on file  Other Topics Concern   Not on file  Social History Narrative   Not on file   Social Drivers of Health   Financial Resource Strain: Not on  file  Food Insecurity: Not on file  Transportation Needs: Not on file  Physical Activity: Not on file  Stress: Not on file  Social Connections: Not on file  Intimate Partner Violence: Not on file    Family History  Problem Relation Age of Onset   Alcoholism Father     Allergies  Allergen Reactions   Prednisone  Other (See Comments)    Muscle pain    Outpatient Medications Prior to Visit  Medication Sig Note   albuterol  (VENTOLIN  HFA) 108 (90 Base) MCG/ACT inhaler Inhale 2 puffs into the lungs every 6 (six) hours as needed for wheezing or shortness of breath. 02/21/2024: Send all refills to [facility] on [address]   Fluticasone -Salmeterol (ADVAIR) 250-50 MCG/DOSE AEPB Inhale 1 puff into the lungs 2 (two) times daily as needed (shortness of breath).    rosuvastatin  (CRESTOR ) 10 MG tablet Take 1 tablet (10 mg total) by mouth daily.    [DISCONTINUED] amLODipine  (NORVASC ) 10 MG tablet Take 1 tablet (10 mg total) by mouth every morning.    [DISCONTINUED] chlorthalidone  (HYGROTON ) 25 MG tablet Take 1 tablet (25 mg total) by mouth every morning.    [DISCONTINUED] fluticasone -salmeterol (WIXELA INHUB) 250-50 MCG/ACT AEPB Inhale 1 puff into the lungs in the morning and at bedtime.    [DISCONTINUED] sildenafil  (VIAGRA ) 100 MG tablet Take 1 tablet (100 mg total) by mouth  as needed for erectile dysfunction (daily).    No facility-administered medications prior to visit.    Review of Systems  Constitutional: Negative.  Negative for malaise/fatigue.  HENT: Negative.    Eyes: Negative.   Respiratory:  Negative for cough. Wheezing: rarely.  Cardiovascular: Negative.  Negative for chest pain and palpitations.  Gastrointestinal: Negative.   Genitourinary: Negative.   Musculoskeletal:  Negative for back pain and neck pain.  Skin: Negative.   Neurological: Negative.  Negative for dizziness.  Endo/Heme/Allergies: Negative.        Objective:   BP 130/80   Pulse 61   Temp (!) 96.8 F (36 C)    Ht 5\' 10"  (1.778 m)   Wt 177 lb 3.2 oz (80.4 kg)   SpO2 97%   BMI 25.43 kg/m   Vitals:   02/21/24 1039  BP: 130/80  Pulse: 61  Temp: (!) 96.8 F (36 C)  Height: 5\' 10"  (1.778 m)  Weight: 177 lb 3.2 oz (80.4 kg)  SpO2: 97%  BMI (Calculated): 25.43    Physical Exam Vitals reviewed.  Constitutional:      Appearance: Normal appearance.  HENT:     Head: Normocephalic.     Left Ear: There is no impacted cerumen.     Nose: Nose normal.     Mouth/Throat:     Mouth: Mucous membranes are moist.     Pharynx: No posterior oropharyngeal erythema.  Eyes:     Extraocular Movements: Extraocular movements intact.     Pupils: Pupils are equal, round, and reactive to light.  Cardiovascular:     Rate and Rhythm: Regular rhythm.     Chest Wall: PMI is not displaced.     Pulses: Normal pulses.     Heart sounds: Normal heart sounds. No murmur heard. Pulmonary:     Effort: Pulmonary effort is normal.     Breath sounds: Normal air entry. No rhonchi or rales.  Abdominal:     General: Abdomen is flat. Bowel sounds are normal. There is no distension.     Palpations: Abdomen is soft. There is no hepatomegaly, splenomegaly or mass.     Tenderness: There is no abdominal tenderness.  Musculoskeletal:        General: Normal range of motion.     Cervical back: Normal range of motion and neck supple.     Right lower leg: No edema.     Left lower leg: No edema.  Skin:    General: Skin is warm and dry.  Neurological:     General: No focal deficit present.     Mental Status: He is alert and oriented to person, place, and time.     Cranial Nerves: No cranial nerve deficit.     Motor: No weakness.  Psychiatric:        Mood and Affect: Mood normal.        Behavior: Behavior normal.      No results found for any visits on 02/21/24.  Recent Results (from the past 2160 hours)  Hemoglobin A1c     Status: Abnormal   Collection Time: 01/31/24  1:31 PM  Result Value Ref Range   Hgb A1c MFr  Bld 5.7 (H) 4.8 - 5.6 %    Comment:          Prediabetes: 5.7 - 6.4          Diabetes: >6.4          Glycemic control for adults with diabetes: <7.0  Est. average glucose Bld gHb Est-mCnc 117 mg/dL  TSH     Status: None   Collection Time: 01/31/24  1:31 PM  Result Value Ref Range   TSH 2.570 0.450 - 4.500 uIU/mL  CMP14+EGFR     Status: None   Collection Time: 01/31/24  1:31 PM  Result Value Ref Range   Glucose 88 70 - 99 mg/dL   BUN 17 8 - 27 mg/dL   Creatinine, Ser 9.14 0.76 - 1.27 mg/dL   eGFR 74 >78 GN/FAO/1.30   BUN/Creatinine Ratio 17 10 - 24   Sodium 137 134 - 144 mmol/L   Potassium 4.2 3.5 - 5.2 mmol/L   Chloride 99 96 - 106 mmol/L   CO2 25 20 - 29 mmol/L   Calcium  8.9 8.6 - 10.2 mg/dL   Total Protein 6.3 6.0 - 8.5 g/dL   Albumin 3.9 3.7 - 4.7 g/dL   Globulin, Total 2.4 1.5 - 4.5 g/dL   Bilirubin Total 0.9 0.0 - 1.2 mg/dL   Alkaline Phosphatase 87 44 - 121 IU/L   AST 32 0 - 40 IU/L   ALT 29 0 - 44 IU/L  Lipid panel     Status: None   Collection Time: 01/31/24  1:31 PM  Result Value Ref Range   Cholesterol, Total 168 100 - 199 mg/dL   Triglycerides 46 0 - 149 mg/dL   HDL 74 >86 mg/dL   VLDL Cholesterol Cal 10 5 - 40 mg/dL   LDL Chol Calc (NIH) 84 0 - 99 mg/dL   Chol/HDL Ratio 2.3 0.0 - 5.0 ratio    Comment:                                   T. Chol/HDL Ratio                                             Men  Women                               1/2 Avg.Risk  3.4    3.3                                   Avg.Risk  5.0    4.4                                2X Avg.Risk  9.6    7.1                                3X Avg.Risk 23.4   11.0       Assessment & Plan:  As per problem list  Problem List Items Addressed This Visit       Cardiovascular and Mediastinum   Primary hypertension   Relevant Medications   sildenafil  (VIAGRA ) 100 MG tablet   amLODipine  (NORVASC ) 10 MG tablet   chlorthalidone  (HYGROTON ) 25 MG tablet     Respiratory   Moderate persistent  asthma without complication   Relevant Medications   fluticasone -salmeterol (WIXELA INHUB) 250-50 MCG/ACT AEPB  Other   Mixed hyperlipidemia - Primary   Relevant Medications   sildenafil  (VIAGRA ) 100 MG tablet   amLODipine  (NORVASC ) 10 MG tablet   chlorthalidone  (HYGROTON ) 25 MG tablet   Other Relevant Orders   Comprehensive metabolic panel with GFR   Lipid panel   CK   Other Visit Diagnoses       Other male erectile dysfunction       Relevant Medications   sildenafil  (VIAGRA ) 100 MG tablet       Return in about 4 months (around 06/23/2024) for fu with labs prior.   Total time spent: 20 minutes  Arzella Bitters, MD  02/21/2024   This document may have been prepared by [facility] Voice Recognition software and as such may include unintentional dictation errors.

## 2024-02-21 NOTE — Addendum Note (Signed)
 Addended by: Vanda Gemma AHMAD on: 02/21/2024 11:05 AM   Modules accepted: Orders

## 2024-05-02 DIAGNOSIS — H269 Unspecified cataract: Secondary | ICD-10-CM | POA: Diagnosis not present

## 2024-05-02 DIAGNOSIS — M2040 Other hammer toe(s) (acquired), unspecified foot: Secondary | ICD-10-CM | POA: Diagnosis not present

## 2024-05-02 DIAGNOSIS — N182 Chronic kidney disease, stage 2 (mild): Secondary | ICD-10-CM | POA: Diagnosis not present

## 2024-05-02 DIAGNOSIS — E785 Hyperlipidemia, unspecified: Secondary | ICD-10-CM | POA: Diagnosis not present

## 2024-05-02 DIAGNOSIS — J449 Chronic obstructive pulmonary disease, unspecified: Secondary | ICD-10-CM | POA: Diagnosis not present

## 2024-05-02 DIAGNOSIS — I129 Hypertensive chronic kidney disease with stage 1 through stage 4 chronic kidney disease, or unspecified chronic kidney disease: Secondary | ICD-10-CM | POA: Diagnosis not present

## 2024-05-02 DIAGNOSIS — N529 Male erectile dysfunction, unspecified: Secondary | ICD-10-CM | POA: Diagnosis not present

## 2024-05-02 DIAGNOSIS — Z7951 Long term (current) use of inhaled steroids: Secondary | ICD-10-CM | POA: Diagnosis not present

## 2024-05-02 DIAGNOSIS — M1990 Unspecified osteoarthritis, unspecified site: Secondary | ICD-10-CM | POA: Diagnosis not present

## 2024-05-02 DIAGNOSIS — R269 Unspecified abnormalities of gait and mobility: Secondary | ICD-10-CM | POA: Diagnosis not present

## 2024-05-02 DIAGNOSIS — Z8249 Family history of ischemic heart disease and other diseases of the circulatory system: Secondary | ICD-10-CM | POA: Diagnosis not present
